# Patient Record
Sex: Female | Born: 1950
Health system: Southern US, Community
[De-identification: ages and names within clinical notes are randomized; demographics above are authoritative.]

## PROBLEM LIST (undated history)

## (undated) DIAGNOSIS — I10 Essential (primary) hypertension: Secondary | ICD-10-CM

## (undated) DIAGNOSIS — C801 Malignant (primary) neoplasm, unspecified: Secondary | ICD-10-CM

## (undated) DIAGNOSIS — R55 Syncope and collapse: Secondary | ICD-10-CM

## (undated) DIAGNOSIS — F329 Major depressive disorder, single episode, unspecified: Secondary | ICD-10-CM

## (undated) DIAGNOSIS — G4700 Insomnia, unspecified: Secondary | ICD-10-CM

## (undated) DIAGNOSIS — Z86010 Personal history of colonic polyps: Secondary | ICD-10-CM

## (undated) DIAGNOSIS — D649 Anemia, unspecified: Secondary | ICD-10-CM

## (undated) DIAGNOSIS — G8929 Other chronic pain: Secondary | ICD-10-CM

## (undated) DIAGNOSIS — F32A Depression, unspecified: Secondary | ICD-10-CM

## (undated) DIAGNOSIS — N200 Calculus of kidney: Secondary | ICD-10-CM

## (undated) HISTORY — PX: MASTECTOMY: SHX3

## (undated) HISTORY — DX: Calculus of kidney: N20.0

## (undated) HISTORY — DX: Malignant (primary) neoplasm, unspecified: C80.1

## (undated) HISTORY — DX: Personal history of colonic polyps: Z86.010

## (undated) HISTORY — DX: Major depressive disorder, single episode, unspecified: F32.9

## (undated) HISTORY — PX: PARTIAL HYSTERECTOMY: SHX80

## (undated) HISTORY — DX: Anemia, unspecified: D64.9

## (undated) HISTORY — DX: Syncope and collapse: R55

## (undated) HISTORY — PX: COLONOSCOPY: SHX174

## (undated) HISTORY — PX: CHOLECYSTECTOMY: SHX55

## (undated) HISTORY — PX: TOTAL ABDOMINAL HYSTERECTOMY: SHX209

## (undated) HISTORY — DX: Depression, unspecified: F32.A

## (undated) HISTORY — DX: Insomnia, unspecified: G47.00

## (undated) HISTORY — DX: Other chronic pain: G89.29

---

## 1981-05-18 HISTORY — PX: CHOLECYSTECTOMY: SHX55

## 1998-05-18 DIAGNOSIS — C50919 Malignant neoplasm of unspecified site of unspecified female breast: Secondary | ICD-10-CM

## 1998-05-18 HISTORY — DX: Malignant neoplasm of unspecified site of unspecified female breast: C50.919

## 1998-07-02 DIAGNOSIS — C50919 Malignant neoplasm of unspecified site of unspecified female breast: Secondary | ICD-10-CM | POA: Insufficient documentation

## 1998-07-11 ENCOUNTER — Other Ambulatory Visit: Admission: RE | Admit: 1998-07-11 | Discharge: 1998-07-11 | Payer: Self-pay | Admitting: Unknown Physician Specialty

## 1999-03-07 ENCOUNTER — Encounter: Admission: RE | Admit: 1999-03-07 | Discharge: 1999-06-05 | Payer: Self-pay | Admitting: Radiation Oncology

## 1999-05-07 ENCOUNTER — Encounter: Admission: RE | Admit: 1999-05-07 | Discharge: 1999-05-07 | Payer: Self-pay | Admitting: Radiation Oncology

## 2000-09-29 ENCOUNTER — Other Ambulatory Visit: Admission: RE | Admit: 2000-09-29 | Discharge: 2000-09-29 | Payer: Self-pay | Admitting: Oral Surgery

## 2004-08-14 ENCOUNTER — Ambulatory Visit: Payer: Self-pay | Admitting: Oncology

## 2005-08-12 ENCOUNTER — Ambulatory Visit: Payer: Self-pay | Admitting: Oncology

## 2006-02-18 ENCOUNTER — Ambulatory Visit: Payer: Self-pay | Admitting: Oncology

## 2006-08-05 ENCOUNTER — Ambulatory Visit: Payer: Self-pay | Admitting: Oncology

## 2006-12-29 ENCOUNTER — Ambulatory Visit: Payer: Self-pay | Admitting: Oncology

## 2007-09-21 ENCOUNTER — Inpatient Hospital Stay (HOSPITAL_COMMUNITY): Admission: RE | Admit: 2007-09-21 | Discharge: 2007-09-23 | Payer: Self-pay | Admitting: Obstetrics and Gynecology

## 2007-09-21 ENCOUNTER — Encounter (HOSPITAL_COMMUNITY): Payer: Self-pay | Admitting: Obstetrics and Gynecology

## 2007-09-27 ENCOUNTER — Inpatient Hospital Stay (HOSPITAL_COMMUNITY): Admission: AD | Admit: 2007-09-27 | Discharge: 2007-09-28 | Payer: Self-pay | Admitting: Obstetrics and Gynecology

## 2009-05-18 HISTORY — PX: TOTAL ABDOMINAL HYSTERECTOMY: SHX209

## 2010-09-30 NOTE — H&P (Signed)
Karina Shelton, Karina Shelton               ACCOUNT NO.:  [id number]   MEDICAL RECORD NO.:  [id number]          PATIENT TYPE:  AMB   LOCATION:  SDC                           [facility]ACILITY:  [facility]   PHYSICIAN:  Zelphia Cairo, MD    DATE OF BIRTH:  1950-07-26   DATE OF ADMISSION:  09/21/2007  DATE OF DISCHARGE:                              HISTORY & PHYSICAL   A 60 year old white female who initially presented to the office in  referral for an ovarian cyst.  She had a CT done at which time an  incidental right ovarian cyst was measured at 2 cm.  This was confirmed  with vaginal ultrasound.  She denies any abdominal pain, bloating,  weight gain or weight loss.  Ovarian cyst was followed on ultrasound and  remained stable for over 1 year.  Given that she is postmenopausal with  a history of breast cancer, she elected to have surgical removal of  ovarian cyst.  CA-125 was normal at 5.1. Given that she is  postmenopausal, she elected to have bilateral salpingo-oophorectomy.   PAST MEDICAL HISTORY:  1. Fibromyalgia.  2. Stage III. breast cancer.   SURGICAL HISTORY:  1. Right mastectomy and in January 2000 followed by chemotherapy and      radiation.  2. Left mastectomy in January 2000.  3. Cholecystectomy in 1971.  4. LAVH 1993.   ALLERGIES:  None.   MEDICATIONS:  Trazodone, Ultram and calcium.   OB HISTORY:  Includes three vaginal deliveries without complications.   GYN HISTORY:  Is negative for history of abnormal Pap smears.   FAMILY HISTORY:  Significant with a mother with breast cancer diagnosed  at the age of 4, deceased at the age of 5.   SOCIAL HISTORY:  Is negative for tobacco use   PHYSICAL EXAMINATION:  VITAL SIGNS:  Height 5 feet 5 inches, weight 226,  blood pressure 118/80, hemoglobin 14.7.  Urinalysis negative.  HEAD AND NECK:  Normal.  No thyromegaly or nodularity.  HEART:  Regular rate and rhythm.  LUNGS:  Clear bilaterally.  ABDOMEN:  Soft, nontender, nondistended.  PELVIC:  Exam shows normal external female genitalia and urethral  meatus.  Vagina is normal without lesions.  No adnexal masses are  palpated on exam.   Ultrasound performed August 15, 2007, showed a normal-appearing left  ovary, right ovary with a 19 x 14 x 13 mm simple cyst.  No fluid in the  cul-de-sac   ASSESSMENT AND PLAN:  A 60 year old postmenopausal woman with persistent  right ovarian cyst and a history of breast cancer.  Plan for bilateral  salpingo-oophorectomy. Risks, benefits and alternatives were discussed,  and informed consent was obtained.      Zelphia Cairo, MD  Electronically Signed     GA/MEDQ  D:  09/20/2007  T:  09/20/2007  Job:  [id number]

## 2010-09-30 NOTE — Op Note (Signed)
NAMESTORY, CONTI               ACCOUNT NO.:  [id number]   MEDICAL RECORD NO.:  [id number]          PATIENT TYPE:  INP   LOCATION:  9303                          [facility]ILITY:  [facility]   PHYSICIAN:  Zelphia Cairo, MD    DATE OF BIRTH:  06/29/1950   DATE OF PROCEDURE:  09/21/2007  DATE OF DISCHARGE:                               OPERATIVE REPORT   PREOPERATIVE DIAGNOSIS:  1. Ovarian cyst.  2. History of breast cancer.   PROCEDURE:  1. Laparoscopy.  2. Laparotomy with bilateral salpingo-oophorectomy.   SURGEON:  Zelphia Cairo, MD   ASSISTANT:  Duke Salvia. Marcelle Overlie, MD   ESTIMATED BLOOD LOSS:  200 mL.   URINE OUTPUT:  125 mL of clear urine.   COMPLICATIONS:  None.   CONDITION:  Stable to recovery room.   PROCEDURE:  Sylvie was taken to the operating room where she was placed  in the dorsal lithotomy position.  General anesthesia was obtained  without difficulty.  She was prepped and draped in the sterile fashion,  and a Foley catheter was inserted sterilely.  Our attention was then  turned to the abdomen, and infraumbilical skin incision was made with  the scalpel and this was extended bluntly to the level of the fascia  using a Kelly clamp.  An optical trocar was then used to enter the  peritoneal cavity.  Once intraperitoneal placement was confirmed, CO2  was turned on and the abdomen and pelvis were insufflated.  The patient  was placed in Trendelenburg position.  The suprapubic incision was made  with a scalpel, and a 5-mm trocar was inserted under direct  visualization.  A blunt probe was then inserted into the suprapubic  port, and the bowel was swept out of the cul-de-sac.  There was small  amount of adhesions of the large bowel to the vaginal cuff.  For this  reason, the left adnexal region could not be well visualized.  A third  port was placed in the left lower quadrant.  A 5-mm incision was made  with a scalpel, and a trocar was inserted under direct visualization.  Despite additional instrument being used to retract bowel, we were  unable to adequately visualize adnexa in order to perform bilateral  salpingo-oophorectomy.  Bilateral adnexa were noted to be adhered to the  sidewalls.  At this time, decision was made to proceed with laparotomy.   A Pfannenstiel skin incision was then made with a scalpel carried down  to the underlying fascia.  The fascia was incised in the midline that  was extended laterally using Mayo scissors.  Kocher clamps were used to  grasp the inferior portion of the fascia.  This was tented upwards and  the underlying rectus muscles were dissected off using Mayo scissors.  The superior portion of the fascia was then grasped with the Kocher  clamps.  This was tented upwards, and the underlying rectus muscles were  dissected off using Mayo scissors.  The peritoneum was then identified  and entered sharply.  This was extended superiorly and inferiorly with  good visualization of the bladder.  The  O'Connor-O'Sullivan retractor  was then placed.  The bowel was packed out of the pelvis using moist lap  sponges.  At this point, the left ovary was grasped with a Babcock  clamp.  The retroperitoneal space was opened, and the ureter was  identified.  The infundibular pelvic ligament was then doubly clamped  using Heaney clamp, transected with Mayo scissors and doubly suture  ligated.  Hemostasis was assured.  The left adnexa was then dissected  free of the left pelvic sidewall using Metzenbaum scissors keeping the  left ureter visualized and free of our operative field.  The remaining  band of adhesions between the sidewall and ovary was then clamped with a  Heaney clamp, transected with Mayo scissors and this was suture ligated  with Vicryl.  Once hemostasis was assured at the left pelvis, our  attention was turned to the right pelvis.   This procedure was repeated, the right ovary was grasped with a Babcock  clamp and tented  outwards.  The retroperitoneal space was opened using  Metzenbaum scissors and dissected along the course of the IP ligament.  The ureter was identified and found to be inferior to our operative  field and pedicle.  The IP ligament was then doubly clamped with Heaney  clamp, transected with Mayo scissors, and double ligated using Vicryl.  Hemostasis was assured.  The remainder of the right ovary was then freed  from the pelvic sidewall using sharp dissection with Metzenbaum  scissors.  Both specimens were passed off and sent to pathology for  review.  The pelvis was then irrigated with warm normal saline.  Both  pedicles and operative sites were reinspected to ensure hemostasis.  Retractor and packs were then removed from the abdomen and pelvis.  The  fascia was closed using a looped zero PDS in a running locked fashion,  and the skin was closed with staples.  Infraumbilical skin incision was  reapproximated using 3-0 Vicryl.  The right lower quadrant trocar site  was reapproximated using Dermabond.  The patient tolerated the procedure  well.  Sponge lap, needle, and instrument counts were correct x2.  She  was taken to the recovery room in stable condition.      Zelphia Cairo, MD  Electronically Signed     GA/MEDQ  D:  09/22/2007  T:  09/23/2007  Job:  [id number]

## 2012-02-06 ENCOUNTER — Emergency Department (HOSPITAL_COMMUNITY)
Admission: EM | Admit: 2012-02-06 | Discharge: 2012-02-06 | Disposition: A | Discharge status: Home / Self Care | Payer: BC Managed Care – PPO | Attending: Emergency Medicine | Admitting: Emergency Medicine

## 2012-02-06 ENCOUNTER — Emergency Department (HOSPITAL_COMMUNITY): Payer: BC Managed Care – PPO

## 2012-02-06 ENCOUNTER — Encounter (HOSPITAL_COMMUNITY): Payer: Self-pay | Admitting: Physical Medicine and Rehabilitation

## 2012-02-06 DIAGNOSIS — S060XAA Concussion with loss of consciousness status unknown, initial encounter: Secondary | ICD-10-CM

## 2012-02-06 DIAGNOSIS — S060X9A Concussion with loss of consciousness of unspecified duration, initial encounter: Secondary | ICD-10-CM | POA: Insufficient documentation

## 2012-02-06 DIAGNOSIS — Y92009 Unspecified place in unspecified non-institutional (private) residence as the place of occurrence of the external cause: Secondary | ICD-10-CM | POA: Insufficient documentation

## 2012-02-06 DIAGNOSIS — H1131 Conjunctival hemorrhage, right eye: Secondary | ICD-10-CM

## 2012-02-06 DIAGNOSIS — H1130 Conjunctival hemorrhage, unspecified eye: Secondary | ICD-10-CM | POA: Insufficient documentation

## 2012-02-06 DIAGNOSIS — W1809XA Striking against other object with subsequent fall, initial encounter: Secondary | ICD-10-CM | POA: Insufficient documentation

## 2012-02-06 DIAGNOSIS — H05239 Hemorrhage of unspecified orbit: Secondary | ICD-10-CM

## 2012-02-06 LAB — COMPREHENSIVE METABOLIC PANEL
ALT: 17 U/L (ref 0–35)
AST: 19 U/L (ref 0–37)
Albumin: 3.3 g/dL — ABNORMAL LOW (ref 3.5–5.2)
Alkaline Phosphatase: 91 U/L (ref 39–117)
BUN: 9 mg/dL (ref 6–23)
CO2: 30 mEq/L (ref 19–32)
Calcium: 9.1 mg/dL (ref 8.4–10.5)
Chloride: 105 mEq/L (ref 96–112)
Creatinine, Ser: 0.65 mg/dL (ref 0.50–1.10)
GFR calc Af Amer: >90 mL/min (ref >90)
GFR calc non Af Amer: >90 mL/min (ref >90)
Glucose, Bld: 91 mg/dL (ref 70–99)
Potassium: 3.4 mEq/L — ABNORMAL LOW (ref 3.5–5.1)
Sodium: 140 mEq/L (ref 135–145)
Total Bilirubin: 0.3 mg/dL (ref 0.3–1.2)
Total Protein: 6.3 g/dL (ref 6.0–8.3)

## 2012-02-06 LAB — CBC WITH DIFFERENTIAL/PLATELET
Basophils Absolute: 0 10*3/uL (ref 0.0–0.1)
Basophils Relative: 0 % (ref 0–1)
Eosinophils Absolute: 0.1 10*3/uL (ref 0.0–0.7)
Eosinophils Relative: 2 % (ref 0–5)
HCT: 38.8 % (ref 36.0–46.0)
Hemoglobin: 12.9 g/dL (ref 12.0–15.0)
Lymphocytes Relative: 36 % (ref 12–46)
Lymphs Abs: 1.6 10*3/uL (ref 0.7–4.0)
MCH: 29.1 pg (ref 26.0–34.0)
MCHC: 33.2 g/dL (ref 30.0–36.0)
MCV: 87.4 fL (ref 78.0–100.0)
Monocytes Absolute: 0.3 10*3/uL (ref 0.1–1.0)
Monocytes Relative: 7 % (ref 3–12)
Neutro Abs: 2.5 10*3/uL (ref 1.7–7.7)
Neutrophils Relative %: 54 % (ref 43–77)
Platelets: 210 10*3/uL (ref 150–400)
RBC: 4.44 MIL/uL (ref 3.87–5.11)
RDW: 12.5 % (ref 11.5–15.5)
WBC: 4.6 10*3/uL (ref 4.0–10.5)

## 2012-02-06 LAB — TROPONIN I: Troponin I: <0.3 ng/mL (ref <0.30)

## 2012-02-06 LAB — CK: Total CK: 73 U/L (ref 7–177)

## 2012-02-06 MED ORDER — HYDROCODONE-ACETAMINOPHEN 5-325 MG PO TABS
2.0000 | ORAL_TABLET | Freq: Once | ORAL | Status: AC
  Administered 2012-02-06: 2 via ORAL
  Filled 2012-02-06: qty 2

## 2012-02-06 MED ORDER — HYDROCODONE-ACETAMINOPHEN 5-325 MG PO TABS: ORAL_TABLET | ORAL | Status: DC

## 2012-02-06 NOTE — ED Notes (Signed)
Upon arrival to room, pt was immediately transport to CT, pt alert, no distress noted.

## 2012-02-06 NOTE — ED Notes (Signed)
Pt in s/p fall around 230 am today, pt was confused at time of fall per husband and was unsure of cause of fall, pt was found on flood by husband and assited back to bed. Pt states she was going to the bathroom but ended up in a different room and is unsure how she ended up there, pt admits to urinary incontinence at time of fall but states she was originally trying to go to the bathroom. Pt with history over last year of similar episodes, pt alert and oriented at this time, no neuro deficits noted. Pt with bruising noted to right side of face and redness to left eye. Pt c/o severe headache at this time, rated 10/10. Pt answering questions appropriately. Normal movement with all extremities.

## 2012-02-06 NOTE — ED Notes (Signed)
Pt presents to department for evaluation of fall. States she was at craft [employer] when she fell and struck corner of table. States LOC. Upon arrival pt states severe headache, abrasion/bruising noted to R forehead. R eye also noted to be red and swollen. Pt states "I don't feel like myself today." she is alert and answering questions appropriately. No neurological deficits noted at present.

## 2012-02-06 NOTE — Discharge Instructions (Signed)
 Subconjunctival Hemorrhage Your exam shows you have a subconjunctival hemorrhage. This is a harmless collection of blood covering a portion of the white of the eye. This condition may be due to injury or to straining (lifting, sneezing, or coughing). Often, there is no known cause. Subconjunctival blood does not cause pain or vision problems. This condition needs no treatment. It will take 1 to 2 weeks for the blood to dissolve. If you take aspirin or Coumadin on a daily basis or if you have high blood pressure, you should check with your doctor about the need for further treatment. Please call your doctor if you have problems with your vision, pain around the eye, or any other concerns about your condition. Document Released: 06/11/2004 Document Revised: 04/23/2011 Document Reviewed: 04/01/2009 [facility] Patient Information 2012 [place], [place].      Narcotic and benzodiazepine use may cause drowsiness, slowed breathing or dependence.  Please use with caution and do not drive, operate machinery or watch young children alone while taking them.  Taking combinations of these medications or drinking alcohol  will potentiate these effects.

## 2012-02-06 NOTE — ED Notes (Signed)
Pt returned to room from CT scan

## 2012-02-06 NOTE — ED Provider Notes (Addendum)
History     CSN: [id number]  Arrival date & time 02/06/12  1128   First MD Initiated Contact with Patient 02/06/12 1242      Chief Complaint  Patient presents with  . Altered Mental Status  . Headache  . Fall    (Consider location/radiation/quality/duration/timing/severity/associated sxs/prior treatment) HPI Comments: Last night, pt had to use restroom had gotten up in middle of night as she has done in the past, unsure of what happened, think she was going the wrong way, fell and struck head on table and possibly floor, has cruising adn swelling to right side of face and around eye.  No change in vision, has pain along right side of neck and top of shoulder.  No N/V/D.  Spouse reports pt was disoriented at the time, has improved now. Pt complains of HA.  No numbness or weakness of arms or legs.  She reports had passed out 2 weeks ago and also about 1 year ago while at [employer].  Has not had this previously worked up.  She had begun weaning herself off of Wellbutrin that she had been on for years about 3 months ago, taking half tablet daily, then going to every other day and now has stopped.  Otherwise no changes.  No h/o CAD, CVA.  She denies any recent vomiting, diarrhea, cold symptoms.  She did have a fever blister on bottom of lip that is improving, denies oral trauma from fall or from potential seizure.  She did have urinary incontinence.  She was able to call for spouse last night and he found her on floor, conscious, awake, just disoriented.    Patient is a 61 y.o. female presenting with altered mental status, headaches, and fall. The history is provided by the patient, the spouse and a relative.  Altered Mental Status Associated symptoms include headaches. Pertinent negatives include no chest pain, no abdominal pain and no shortness of breath.  Headache  Pertinent negatives include no fever, no palpitations, no shortness of breath and no nausea.  Fall Associated symptoms include  headaches. Pertinent negatives include no fever, no abdominal pain and no nausea.    History reviewed. No pertinent past medical history.  History reviewed. No pertinent past surgical history.  History reviewed. No pertinent family history.  History  Substance Use Topics  . Smoking status: Never Smoker   . Smokeless tobacco: Not on file  . Alcohol Use: No    OB History    Grav Para Term Preterm Abortions TAB SAB Ect Mult Living                  Review of Systems  Constitutional: Negative for fever and chills.  HENT: Negative for congestion and rhinorrhea.   Eyes: Positive for photophobia and redness. Negative for visual disturbance.  Respiratory: Negative for chest tightness and shortness of breath.   Cardiovascular: Negative for chest pain and palpitations.  Gastrointestinal: Negative for nausea, abdominal pain, diarrhea and blood in stool.  Genitourinary: Negative for dysuria and flank pain.  Musculoskeletal: Negative for back pain.  Skin: Positive for wound. Negative for rash.  Neurological: Positive for syncope and headaches.  Psychiatric/Behavioral: Positive for altered mental status.  All other systems reviewed and are negative.    Allergies  Codeine  Home Medications   Current Outpatient Rx  Name Route Sig Dispense Refill  . ACETAMINOPHEN 500 MG PO TABS Oral Take 500-1,500 mg by mouth every 6 (six) hours as needed. For pain    .  ADULT MULTIVITAMIN W/MINERALS CH Oral Take 1 tablet by mouth daily.    Frazier Butt BALANCE OP Ophthalmic Apply 1 drop to eye every 4 (four) hours as needed. Dry eyes    . TRAMADOL HCL 50 MG PO TABS Oral Take 50-100 mg by mouth every 6 (six) hours as needed. For pain    . TRAZODONE HCL 50 MG PO TABS Oral Take 50-100 mg by mouth at bedtime.    Marland Kitchen ZOLPIDEM TARTRATE 5 MG PO TABS Oral Take 5 mg by mouth at bedtime as needed.    Marland Kitchen HYDROCODONE-ACETAMINOPHEN 5-325 MG PO TABS  1-2 tablets po q 6 hours prn moderate to severe pain 20 tablet 0     BP 111/63  Pulse 67  Temp 98.2 F (36.8 C) (Oral)  Resp 22  SpO2 95%  Physical Exam  Nursing note and vitals reviewed. Constitutional: She is oriented to person, place, and time. She appears well-developed and well-nourished.  HENT:  Head: Normocephalic. Head is with contusion.    Eyes: Pupils are equal, round, and reactive to light. Right conjunctiva is injected. Right conjunctiva has a hemorrhage. No scleral icterus. Right eye exhibits normal extraocular motion. Left eye exhibits normal extraocular motion. Right pupil is round. Left pupil is round. Pupils are equal.  Neck: Normal range of motion. Neck supple.  Cardiovascular: Normal rate and regular rhythm.   No murmur heard. Pulmonary/Chest: Effort normal. No respiratory distress. She has no wheezes.  Abdominal: Soft. She exhibits no distension. There is no tenderness.  Neurological: She is alert and oriented to person, place, and time.  Skin: Skin is warm and dry.  Psychiatric: She has a normal mood and affect.    ED Course  Procedures (including critical care time)  Labs Reviewed  COMPREHENSIVE METABOLIC PANEL - Abnormal; Notable for the following:    Potassium 3.4 (*)     Albumin 3.3 (*)     All other components within normal limits  CBC WITH DIFFERENTIAL  TROPONIN I  CK   Ct Head Wo Contrast  02/06/2012  *RADIOLOGY REPORT*  Clinical Data: History of fall with altered mental status.  CT HEAD WITHOUT CONTRAST  Technique:  Contiguous axial images were obtained from the base of the skull through the vertex without contrast.  Comparison: No priors.  Findings: No acute displaced skull fractures are identified.  No acute intracranial abnormality.  Specifically, no evidence of acute post-traumatic intracranial hemorrhage, no definite regions of acute/subacute cerebral ischemia, no focal mass, mass effect, hydrocephalus or abnormal intra or extra-axial fluid collections. The visualized paranasal sinuses and mastoids are well  pneumatized.  IMPRESSION: 1.  No acute displaced skull fractures or acute intracranial abnormalities. 2.  The appearance of the brain is normal.   Original Report Authenticated By: Florencia Reasons, M.D.      1. Concussion   2. Periorbital hematoma   3. Conjunctival hemorrhage of right eye     RA saturation is 100% which I interpret to be normal.     3:48 PM Pt feels somewhat improved after vicodin.  Troponin is neg.  Pt I think is safe to be discharged, pt can follow up with PCP.  Likely concussion at this point.  No arrythmias see on cardiac monitoring while in the ED.     ECG at time 14:42 shows SR at rate 60, PAC noted, normal intervals, normal axis, no ST or T wave abn.  No priors available.    MDM  No CP, vitals are ok here,  not orthostatic. Pt is alert, oriented, normal coordination and non focal neuro deficits here.  Troponin, ECG are ok.  Head CT shows no fracture, intra-cranial hemorrhage, tumor.  Pt needs follow up with PCP and further testing as outpt for syncope.  Pt does not wish to be admitted.  No arrythmia's noted on monitor while here.  PERC negative.        Gavin Pound. Oletta Lamas, MD 02/06/12 1551  Gavin Pound. Stephfon Bovey, MD 02/06/12 4098

## 2012-09-01 ENCOUNTER — Encounter: Payer: Self-pay | Admitting: Internal Medicine

## 2012-10-05 ENCOUNTER — Ambulatory Visit (AMBULATORY_SURGERY_CENTER): Payer: BC Managed Care – PPO | Admitting: *Deleted

## 2012-10-05 ENCOUNTER — Encounter: Payer: Self-pay | Admitting: Internal Medicine

## 2012-10-05 VITALS — Ht 66.0 in | Wt 153.8 lb

## 2012-10-05 DIAGNOSIS — Z1211 Encounter for screening for malignant neoplasm of colon: Secondary | ICD-10-CM

## 2012-10-05 MED ORDER — NA SULFATE-K SULFATE-MG SULF 17.5-3.13-1.6 GM/177ML PO SOLN: ORAL | Status: DC

## 2012-10-19 ENCOUNTER — Encounter: Payer: Self-pay | Admitting: Internal Medicine

## 2012-10-19 ENCOUNTER — Ambulatory Visit (AMBULATORY_SURGERY_CENTER): Payer: BC Managed Care – PPO | Admitting: Internal Medicine

## 2012-10-19 VITALS — BP 141/59 | HR 60 | Temp 98.4°F | Resp 22 | Ht 66.0 in | Wt 153.0 lb

## 2012-10-19 DIAGNOSIS — Z860100 Personal history of colon polyps, unspecified: Secondary | ICD-10-CM | POA: Insufficient documentation

## 2012-10-19 DIAGNOSIS — Z1211 Encounter for screening for malignant neoplasm of colon: Secondary | ICD-10-CM

## 2012-10-19 DIAGNOSIS — D126 Benign neoplasm of colon, unspecified: Secondary | ICD-10-CM

## 2012-10-19 HISTORY — DX: Personal history of colon polyps, unspecified: Z86.0100

## 2012-10-19 HISTORY — DX: Personal history of colonic polyps: Z86.010

## 2012-10-19 MED ORDER — SODIUM CHLORIDE 0.9 % IV SOLN: 500.0000 mL | INTRAVENOUS | Status: DC

## 2012-10-19 NOTE — Progress Notes (Signed)
Called to room to assist during endoscopic procedure.  Patient ID and intended procedure confirmed with present staff. Received instructions for my participation in the procedure from the performing physician.  

## 2012-10-19 NOTE — Patient Instructions (Addendum)
Two tiny polyps were removed today. No other abnormalities were seen.  I think the polyps are benign. I will let you know pathology results and when to have another routine colonoscopy by mail.  I appreciate the opportunity to care for you. Iva Boop, MD, FACGYOU HAD AN ENDOSCOPIC PROCEDURE TODAY AT THE [facility]: Refer to the procedure report that was given to you for any specific questions about what was found during the examination.  If the procedure report does not answer your questions, please call your gastroenterologist to clarify.  If you requested that your care partner not be given the details of your procedure findings, then the procedure report has been included in a sealed envelope for you to review at your convenience later.  YOU SHOULD EXPECT: Some feelings of bloating in the abdomen. Passage of more gas than usual.  Walking can help get rid of the air that was put into your GI tract during the procedure and reduce the bloating. If you had a lower endoscopy (such as a colonoscopy or flexible sigmoidoscopy) you may notice spotting of blood in your stool or on the toilet paper. If you underwent a bowel prep for your procedure, then you may not have a normal bowel movement for a few days.  DIET: Your first meal following the procedure should be a light meal and then it is ok to progress to your normal diet.  A half-sandwich or bowl of soup is an example of a good first meal.  Heavy or fried foods are harder to digest and may make you feel nauseous or bloated.  Likewise meals heavy in dairy and vegetables can cause extra gas to form and this can also increase the bloating.  Drink plenty of fluids but you should avoid alcoholic beverages for 24 hours.  ACTIVITY: Your care partner should take you home directly after the procedure.  You should plan to take it easy, moving slowly for the rest of the day.  You can resume normal activity the day after the procedure however you  should NOT DRIVE or use heavy machinery for 24 hours (because of the sedation medicines used during the test).    SYMPTOMS TO REPORT IMMEDIATELY: A gastroenterologist can be reached at any hour.  During normal business hours, 8:30 AM to 5:00 PM Monday through Friday, call [phone].  After hours and on weekends, please call the GI answering service at [phone] who will take a message and have the physician on call contact you.   Following lower endoscopy (colonoscopy or flexible sigmoidoscopy):  Excessive amounts of blood in the stool  Significant tenderness or worsening of abdominal pains  Swelling of the abdomen that is new, acute  Fever of 100F or higher    FOLLOW UP: If any biopsies were taken you will be contacted by phone or by letter within the next 1-3 weeks.  Call your gastroenterologist if you have not heard about the biopsies in 3 weeks.  Our staff will call the home number listed on your records the next business day following your procedure to check on you and address any questions or concerns that you may have at that time regarding the information given to you following your procedure. This is a courtesy call and so if there is no answer at the home number and we have not heard from you through the emergency physician on call, we will assume that you have returned to your regular daily activities without incident.  SIGNATURES/CONFIDENTIALITY: You and/or your care partner have signed paperwork which will be entered into your electronic medical record.  These signatures attest to the fact that that the information above on your After Visit Summary has been reviewed and is understood.  Full responsibility of the confidentiality of this discharge information lies with you and/or your care-partner.   Information on polyps given to you today

## 2012-10-19 NOTE — Progress Notes (Signed)
Patient did not experience any of the following events: a burn prior to discharge; a fall within the facility; wrong site/side/patient/procedure/implant event; or a hospital transfer or hospital admission upon discharge from the facility. (G8907) Patient did not have preoperative order for IV antibiotic SSI prophylaxis. (G8918)  

## 2012-10-19 NOTE — Op Note (Signed)
[facility] [address] [place], [address]   COLONOSCOPY PROCEDURE REPORT  PATIENT: Karina Shelton, Karina Shelton  MR#: [id number] BIRTHDATE: [birth year] , 61  yrs. old GENDER: Female ENDOSCOPIST: Iva Boop, MD, [facility] REFERRED [id number] Shary Decamp, M.D. PROCEDURE DATE:  10/19/2012 PROCEDURE:   Colonoscopy with biopsy ASA CLASS:   Class II INDICATIONS:average risk screening and first colonoscopy. MEDICATIONS: propofol (Diprivan) 200mg  IV, MAC sedation, administered by CRNA, and These medications were titrated to patient response per physician's verbal order  DESCRIPTION OF PROCEDURE:   After the risks benefits and alternatives of the procedure were thoroughly explained, informed consent was obtained.  A digital rectal exam revealed no abnormalities of the rectum.   The LB [id number]  endoscope was introduced through the anus and advanced to the cecum, which was identified by both the appendix and ileocecal valve. No adverse events experienced.   The quality of the prep was excellent using Suprep  The instrument was then slowly withdrawn as the colon was fully examined.      COLON FINDINGS: Two sessile polyps measuring 2 and 3 mm in size were found in the descending colon.  A polypectomy was performed with cold forceps.  The resection was complete and the polyp tissue was completely retrieved.   The colon mucosa was otherwise normal.   A right colon retroflexion was performed.  Retroflexed views revealed no abnormalities. The time to cecum=2 minutes 09 seconds. Withdrawal time=11 minutes 59 seconds.  The scope was withdrawn and the procedure completed. COMPLICATIONS: There were no complications.  ENDOSCOPIC IMPRESSION: 1.   Two sessile polyps measuring 2 and 3 mm in size were found in the descending colon; polypectomy was performed with cold forceps 2.   The colon mucosa was otherwise normal  RECOMMENDATIONS: Timing of repeat colonoscopy will be determined by  pathology findings.   eSigned:  Iva Boop, MD, [facility] 10/19/2012 10:31 AM   cc: Feliciana Rossetti, MD, Gery Pray, MD and The Patient

## 2012-10-20 ENCOUNTER — Telehealth: Payer: Self-pay | Admitting: *Deleted

## 2012-10-20 NOTE — Telephone Encounter (Signed)
Name identifier, left message, follow-up 

## 2012-10-24 ENCOUNTER — Encounter: Payer: Self-pay | Admitting: Internal Medicine

## 2012-10-24 NOTE — Progress Notes (Signed)
Quick Note:  2 diminutive adenomas - repeat colon about 10/2017 ______

## 2015-09-13 DIAGNOSIS — Z853 Personal history of malignant neoplasm of breast: Secondary | ICD-10-CM | POA: Diagnosis not present

## 2015-09-21 ENCOUNTER — Encounter (HOSPITAL_COMMUNITY): Payer: Self-pay | Admitting: Emergency Medicine

## 2015-09-21 ENCOUNTER — Emergency Department (HOSPITAL_COMMUNITY)
Admission: EM | Admit: 2015-09-21 | Discharge: 2015-09-22 | Disposition: A | Discharge status: Home / Self Care | Payer: BLUE CROSS/BLUE SHIELD | Attending: Emergency Medicine | Admitting: Emergency Medicine

## 2015-09-21 ENCOUNTER — Emergency Department (HOSPITAL_COMMUNITY): Payer: BLUE CROSS/BLUE SHIELD

## 2015-09-21 DIAGNOSIS — Z86018 Personal history of other benign neoplasm: Secondary | ICD-10-CM | POA: Diagnosis not present

## 2015-09-21 DIAGNOSIS — I10 Essential (primary) hypertension: Secondary | ICD-10-CM | POA: Diagnosis not present

## 2015-09-21 DIAGNOSIS — Z87442 Personal history of urinary calculi: Secondary | ICD-10-CM | POA: Diagnosis not present

## 2015-09-21 DIAGNOSIS — Z853 Personal history of malignant neoplasm of breast: Secondary | ICD-10-CM | POA: Diagnosis not present

## 2015-09-21 DIAGNOSIS — R519 Headache, unspecified: Secondary | ICD-10-CM

## 2015-09-21 DIAGNOSIS — F329 Major depressive disorder, single episode, unspecified: Secondary | ICD-10-CM | POA: Insufficient documentation

## 2015-09-21 DIAGNOSIS — Z79899 Other long term (current) drug therapy: Secondary | ICD-10-CM | POA: Diagnosis not present

## 2015-09-21 DIAGNOSIS — R51 Headache: Secondary | ICD-10-CM | POA: Diagnosis present

## 2015-09-21 HISTORY — DX: Essential (primary) hypertension: I10

## 2015-09-21 LAB — RAPID URINE DRUG SCREEN, HOSP PERFORMED
AMPHETAMINES: NOT DETECTED
BENZODIAZEPINES: NOT DETECTED
Barbiturates: NOT DETECTED
COCAINE: NOT DETECTED
OPIATES: NOT DETECTED
Tetrahydrocannabinol: NOT DETECTED

## 2015-09-21 LAB — DIFFERENTIAL
BASOS PCT: 0 %
Basophils Absolute: 0 10*3/uL (ref 0.0–0.1)
EOS ABS: 0.1 10*3/uL (ref 0.0–0.7)
Eosinophils Relative: 3 %
Lymphocytes Relative: 43 %
Lymphs Abs: 2.4 10*3/uL (ref 0.7–4.0)
MONO ABS: 0.6 10*3/uL (ref 0.1–1.0)
MONOS PCT: 11 %
NEUTROS ABS: 2.5 10*3/uL (ref 1.7–7.7)
Neutrophils Relative %: 43 %

## 2015-09-21 LAB — I-STAT CHEM 8, ED
BUN: 15 mg/dL (ref 6–20)
CALCIUM ION: 1.16 mmol/L (ref 1.13–1.30)
CHLORIDE: 103 mmol/L (ref 101–111)
Creatinine, Ser: 1 mg/dL (ref 0.44–1.00)
Glucose, Bld: 120 mg/dL — ABNORMAL HIGH (ref 65–99)
HCT: 43 % (ref 36.0–46.0)
HEMOGLOBIN: 14.6 g/dL (ref 12.0–15.0)
POTASSIUM: 4.4 mmol/L (ref 3.5–5.1)
SODIUM: 143 mmol/L (ref 135–145)
TCO2: 28 mmol/L (ref 0–100)

## 2015-09-21 LAB — PROTIME-INR
INR: 1.02 (ref 0.00–1.49)
PROTHROMBIN TIME: 13.6 s (ref 11.6–15.2)

## 2015-09-21 LAB — CBC
HCT: 39.9 % (ref 36.0–46.0)
Hemoglobin: 13 g/dL (ref 12.0–15.0)
MCH: 28.6 pg (ref 26.0–34.0)
MCHC: 32.6 g/dL (ref 30.0–36.0)
MCV: 87.9 fL (ref 78.0–100.0)
PLATELETS: 270 10*3/uL (ref 150–400)
RBC: 4.54 MIL/uL (ref 3.87–5.11)
RDW: 13 % (ref 11.5–15.5)
WBC: 5.6 10*3/uL (ref 4.0–10.5)

## 2015-09-21 LAB — COMPREHENSIVE METABOLIC PANEL
ALT: 22 U/L (ref 14–54)
ANION GAP: 8 (ref 5–15)
AST: 23 U/L (ref 15–41)
Albumin: 3.2 g/dL — ABNORMAL LOW (ref 3.5–5.0)
Alkaline Phosphatase: 94 U/L (ref 38–126)
BUN: 11 mg/dL (ref 6–20)
CHLORIDE: 107 mmol/L (ref 101–111)
CO2: 29 mmol/L (ref 22–32)
Calcium: 9.1 mg/dL (ref 8.9–10.3)
Creatinine, Ser: 1.03 mg/dL — ABNORMAL HIGH (ref 0.44–1.00)
GFR, EST NON AFRICAN AMERICAN: 57 mL/min — AB (ref >60)
Glucose, Bld: 132 mg/dL — ABNORMAL HIGH (ref 65–99)
POTASSIUM: 4.2 mmol/L (ref 3.5–5.1)
SODIUM: 144 mmol/L (ref 135–145)
Total Bilirubin: 0.4 mg/dL (ref 0.3–1.2)
Total Protein: 6.2 g/dL — ABNORMAL LOW (ref 6.5–8.1)

## 2015-09-21 LAB — URINALYSIS, ROUTINE W REFLEX MICROSCOPIC
BILIRUBIN URINE: NEGATIVE
Glucose, UA: NEGATIVE mg/dL
HGB URINE DIPSTICK: NEGATIVE
Ketones, ur: 15 mg/dL — AB
Leukocytes, UA: NEGATIVE
Nitrite: NEGATIVE
PH: 6 (ref 5.0–8.0)
PROTEIN: NEGATIVE mg/dL
SPECIFIC GRAVITY, URINE: 1.023 (ref 1.005–1.030)

## 2015-09-21 LAB — ETHANOL

## 2015-09-21 LAB — APTT: APTT: 27 s (ref 24–37)

## 2015-09-21 LAB — I-STAT TROPONIN, ED: TROPONIN I, POC: 0 ng/mL (ref 0.00–0.08)

## 2015-09-21 IMAGING — CT CT HEAD W/O CM
4 series · 17 of 47 positions shown, 19 images · non-contrast
Comparison: [DATE]

CLINICAL DATA: Headache at the left temple with onset at 3 p.m.
today. Blurry vision. History of hypertension.

EXAM:
CT HEAD WITHOUT CONTRAST
TECHNIQUE: Contiguous axial images were obtained from the base of the skull
through the vertex without intravenous contrast.

[Series 2: head without · axial · non-contrast · 0.41mm/px · z∈[-208,-88]mm · 7 of 34 slices shown, 9 images]
[im 5/34  brain]
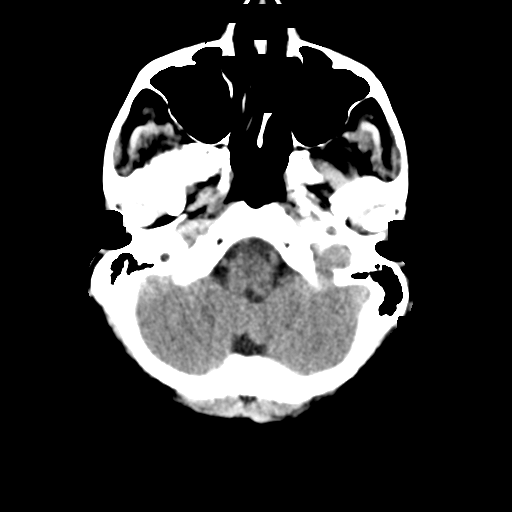
[im 5/34  bone]
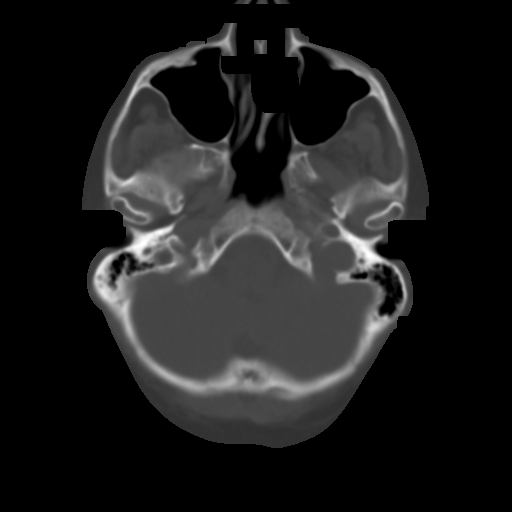
[im 9/34  brain]
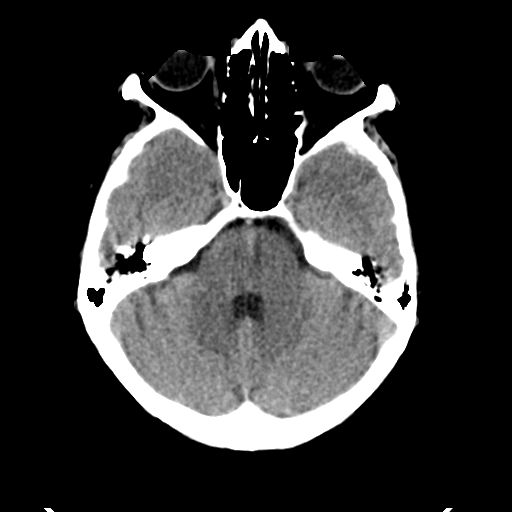
[im 13/34  brain]
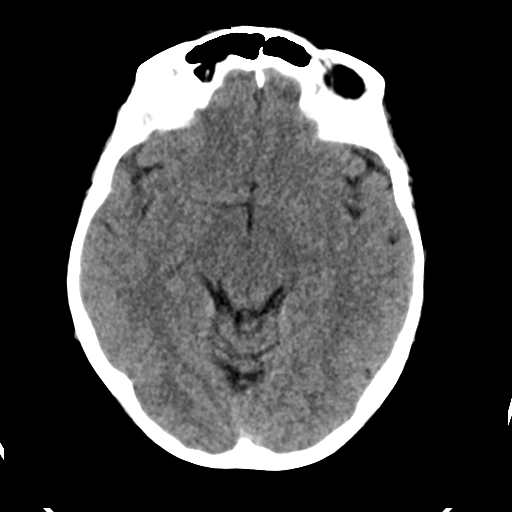
[im 17/34  brain]
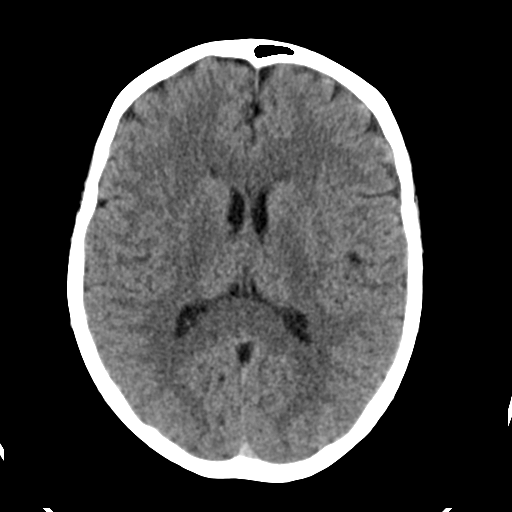
[im 21/34  brain]
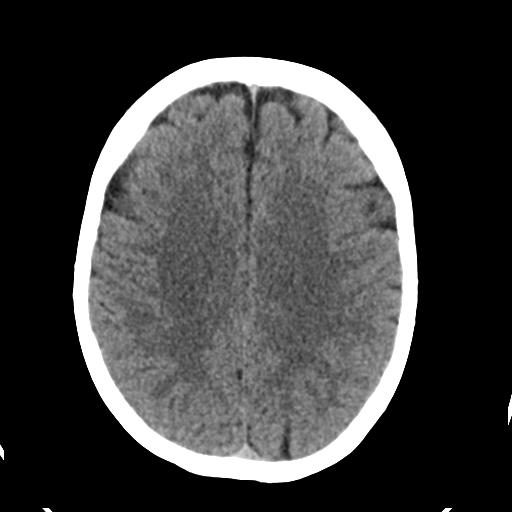
[im 21/34  bone]
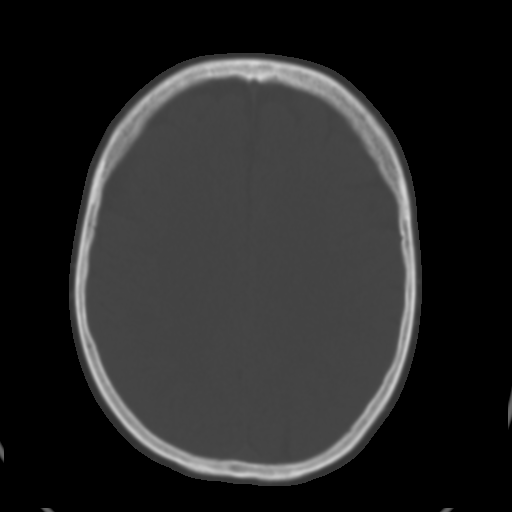
[im 25/34  brain]
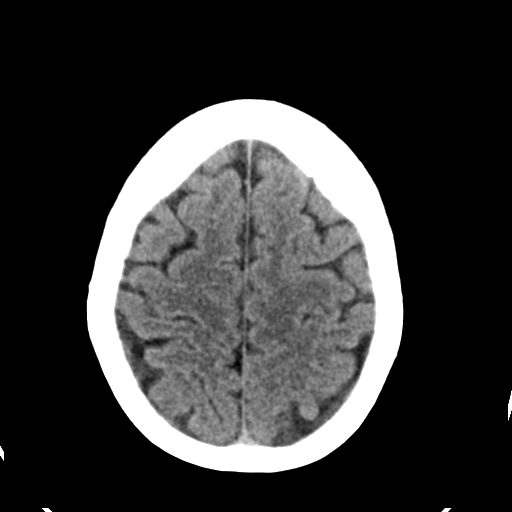
[im 29/34  brain]
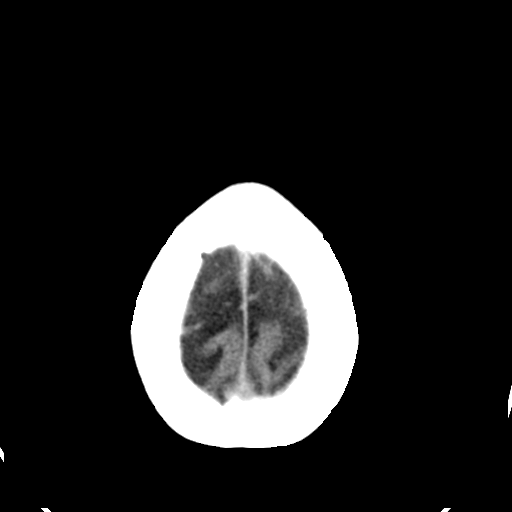

[Series 3: head bone · axial · 0.41mm/px · z∈[-212,-154]mm · 4 of 84 slices shown]
[im 9/84  bone]
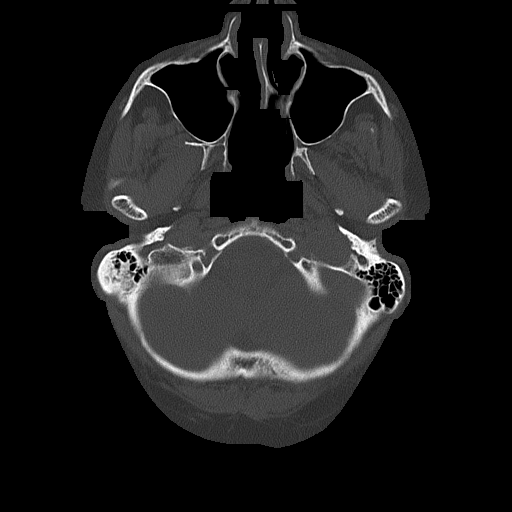
[im 17/84  bone]
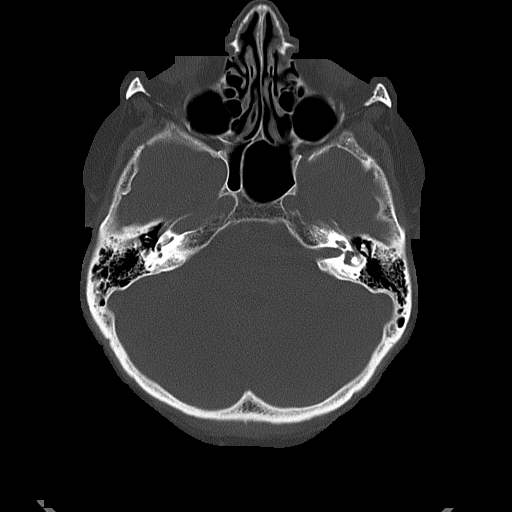
[im 25/84  bone]
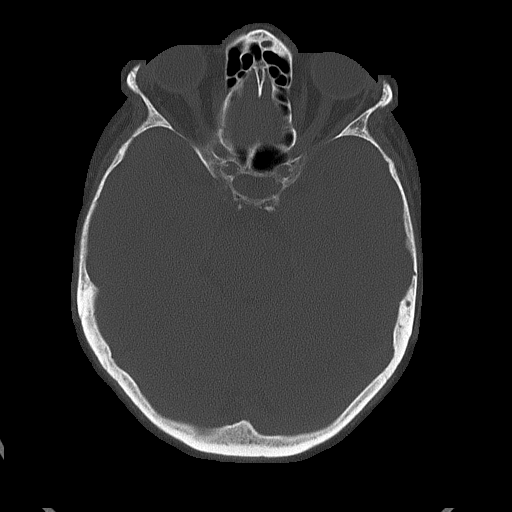
[im 38/84  bone]
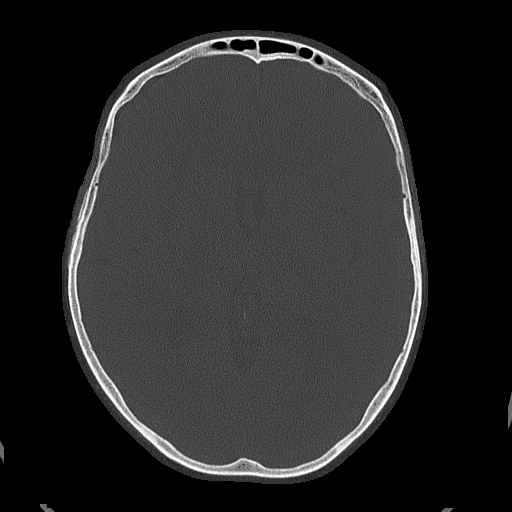

[Series 4: head without cor · coronal · non-contrast · 0.31mm/px · 3 of 67 slices shown]
[im 23/67  brain]
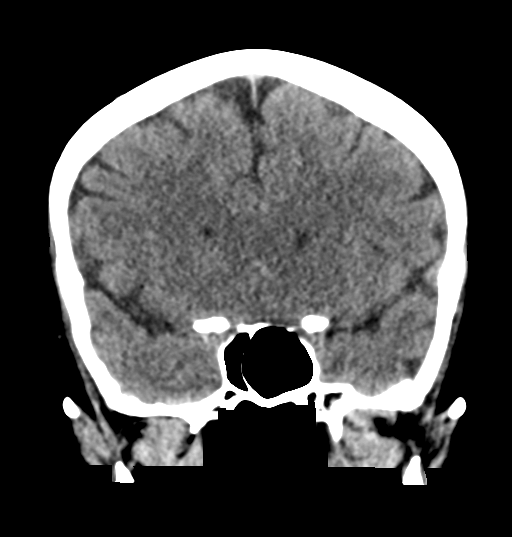
[im 30/67  brain]
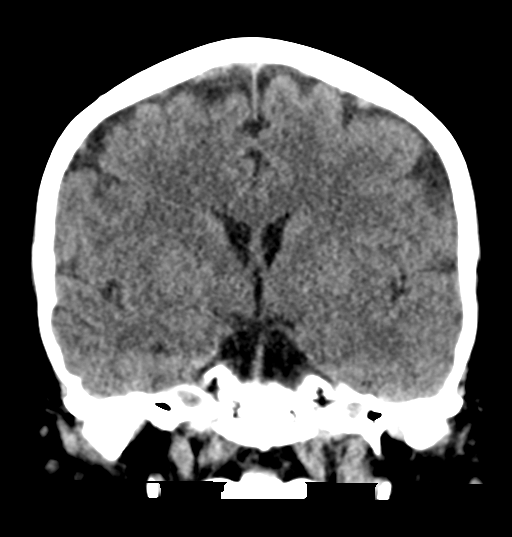
[im 37/67  brain]
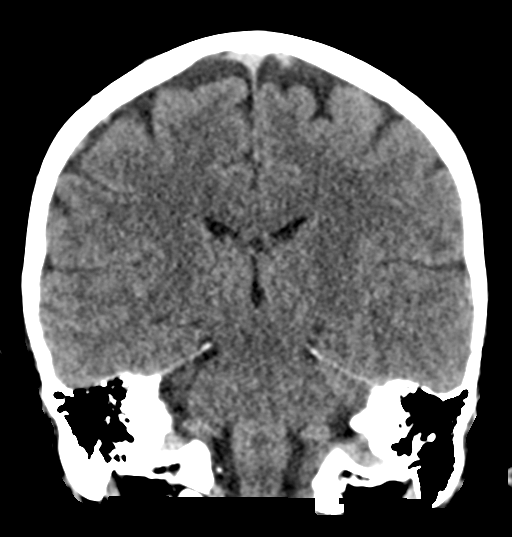

[Series 5: head without sag · sagittal · non-contrast · 0.33mm/px · 3 of 67 slices shown]
[im 23/67  brain]
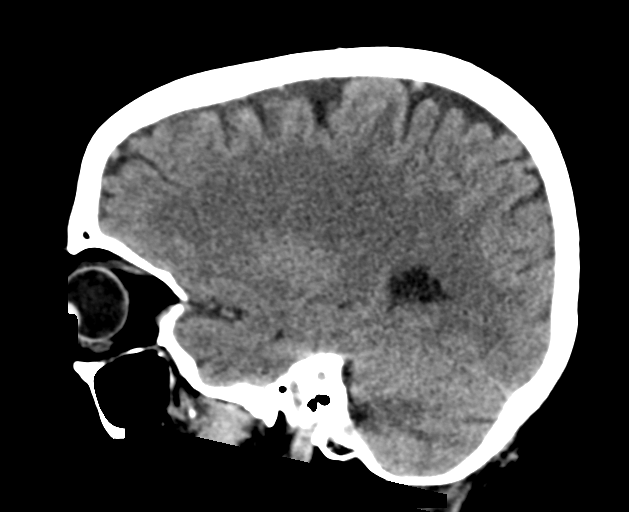
[im 34/67  brain]
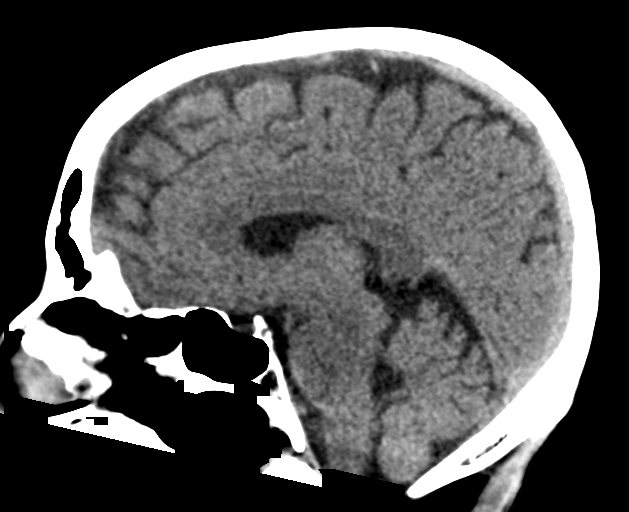
[im 45/67  brain]
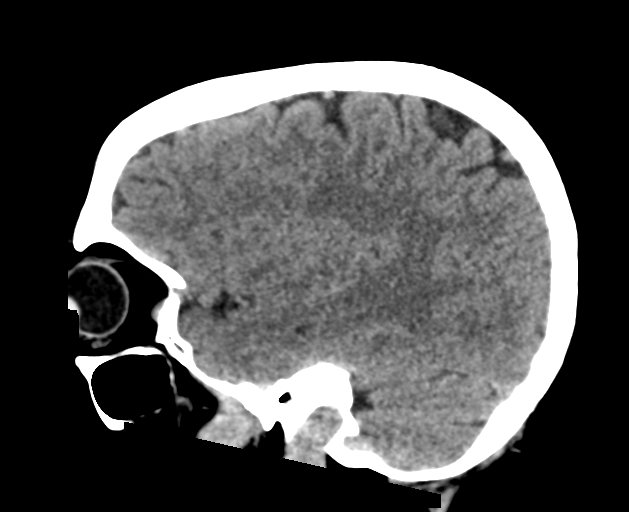

[17 of 47 positions shown; findings below may reference images not displayed]

FINDINGS: Ventricles and sulci are symmetrical. No ventricular dilatation. No
mass effect or midline shift. No abnormal extra-axial fluid
collections. Gray-white matter junctions are distinct. Basal
cisterns are not effaced. No evidence of acute intracranial
hemorrhage. No depressed skull fractures. Opacification of a single
ethmoid air cell. Paranasal sinuses and mastoid air cells are
otherwise clear. Vascular calcifications.
IMPRESSION: No acute intracranial abnormalities.

## 2015-09-21 MED ORDER — KETOROLAC TROMETHAMINE 60 MG/2ML IM SOLN
60.0000 mg | Freq: Once | INTRAMUSCULAR | Status: AC
  Administered 2015-09-21: 60 mg via INTRAMUSCULAR
  Filled 2015-09-21: qty 2

## 2015-09-21 MED ORDER — OXYCODONE-ACETAMINOPHEN 5-325 MG PO TABS
2.0000 | ORAL_TABLET | Freq: Once | ORAL | Status: AC
  Administered 2015-09-21: 2 via ORAL
  Filled 2015-09-21: qty 2

## 2015-09-21 NOTE — ED Provider Notes (Signed)
CSN: [id number]     Arrival date & time 09/21/15  2206 History   By signing my name below, I, Forrestine Him, attest that this documentation has been prepared under the direction and in the presence of Jola Schmidt, MD.  Electronically Signed: Forrestine Him, ED Scribe. 09/21/2015. 11:40 PM.   Chief Complaint  Patient presents with  . Headache   The history is provided by the patient. No language interpreter was used.    HPI Comments: Karina Shelton is a 65 y.o. female with a PMHx of HTN and cancer who presents to the Emergency Department complaining of sudden onset, constant, ongoing pain to the L temporal region onset 3:00 PM this afternoon while quilting. Pt states "it feels like ive been hit". She also reports ongoing blurred vision to eyes bilaterally; R greater than L. No aggravating or alleviating factors. No OTC medications or home remedies attempted prior to arrival. No recent fever, chills, nausea, vomiting, cough, congestion, or chest pain. Pt has a history of breast cancer 17 years ago involving chemotherapy, radiation, and bilateral mastectomy.  PCP: Gilford Rile, MD   ONCOLOGIST: Robbi Garter, MD  Past Medical History  Diagnosis Date  . Depression   . Cancer [facility]) 2000    right breast cancer  . Fainting     x3  . Kidney stone   . Personal history of colonic adenomas 10/19/2012  . Hypertension    Past Surgical History  Procedure Laterality Date  . Mastectomy  2000&2001    bil.  . Cholecystectomy    . Partial hysterectomy    . Total abdominal hysterectomy     Family History  Problem Relation Age of Onset  . Colon cancer Neg Hx    Social History  Substance Use Topics  . Smoking status: Never Smoker   . Smokeless tobacco: Never Used  . Alcohol Use: Yes     Comment: occasional only during events   OB History    No data available     Review of Systems  Constitutional: Negative for fever and chills.  HENT: Negative for congestion.   Respiratory: Negative for  cough and shortness of breath.   Cardiovascular: Negative for chest pain.  Gastrointestinal: Negative for nausea, vomiting and abdominal pain.  Neurological: Positive for headaches (Pain to L temporal area).  Psychiatric/Behavioral: Negative for confusion.  All other systems reviewed and are negative.     Allergies  Codeine  Home Medications   Prior to Admission medications   Medication Sig Start Date End Date Taking? Authorizing Provider  doxylamine, Sleep, (UNISOM) 25 MG tablet Take 25 mg by mouth at bedtime as needed for sleep.   Yes Historical Provider, MD  losartan (COZAAR) 50 MG tablet Take 50 mg by mouth daily. 08/27/15  Yes Historical Provider, MD  Omega-3 Fatty Acids (FISH OIL PO) Take 1 capsule by mouth daily.   Yes Historical Provider, MD  traMADol (ULTRAM) 50 MG tablet Take 50-100 mg by mouth every 6 (six) hours as needed. For pain   Yes Historical Provider, MD  traZODone (DESYREL) 50 MG tablet Take 50-100 mg by mouth at bedtime.   Yes Historical Provider, MD   Triage Vitals: BP 130/78 mmHg  Pulse 91  Temp(Src) 97.8 F (36.6 C) (Oral)  Resp 17  SpO2 98%   Physical Exam  Constitutional: She is oriented to person, place, and time. She appears well-developed and well-nourished. No distress.  HENT:  Head: Normocephalic and atraumatic.  Face symmetric.  Tenderness over left temporal region  without rash.  No scalp lesions noted.  No vesicular lesions over left temporal region.  Eyes: EOM are normal. Pupils are equal, round, and reactive to light.  Neck: Normal range of motion.  Cardiovascular: Normal rate, regular rhythm and normal heart sounds.   Pulmonary/Chest: Effort normal and breath sounds normal.  Abdominal: Soft. She exhibits no distension. There is no tenderness.  Musculoskeletal: Normal range of motion.  Neurological: She is alert and oriented to person, place, and time.  5/5 strength in major muscle groups of  bilateral upper and lower extremities. Speech  normal. No facial asymetry.   Skin: Skin is warm and dry.  Psychiatric: She has a normal mood and affect. Judgment normal.  Nursing note and vitals reviewed.   ED Course  Procedures (including critical care time)  DIAGNOSTIC STUDIES: Oxygen Saturation is 98% on RA, Normal by my interpretation.    COORDINATION OF CARE: 11:33 PM- Will order imaging, blood work, and EKG. Discussed treatment plan with pt at bedside and pt agreed to plan.     Labs Review Labs Reviewed  COMPREHENSIVE METABOLIC PANEL - Abnormal; Notable for the following:    Glucose, Bld 132 (*)    Creatinine, Ser 1.03 (*)    Total Protein 6.2 (*)    Albumin 3.2 (*)    GFR calc non Af Amer 57 (*)    All other components within normal limits  URINALYSIS, ROUTINE W REFLEX MICROSCOPIC (NOT AT [facility]) - Abnormal; Notable for the following:    APPearance HAZY (*)    Ketones, ur 15 (*)    All other components within normal limits  I-STAT CHEM 8, ED - Abnormal; Notable for the following:    Glucose, Bld 120 (*)    All other components within normal limits  ETHANOL  PROTIME-INR  APTT  CBC  DIFFERENTIAL  URINE RAPID DRUG SCREEN, HOSP PERFORMED  SEDIMENTATION RATE  C-REACTIVE PROTEIN  I-STAT TROPOININ, ED    Imaging Review Ct Head Wo Contrast  09/21/2015  CLINICAL DATA:  Headache at the left temple with onset at 3 p.m. today. Blurry vision. History of hypertension. EXAM: CT HEAD WITHOUT CONTRAST TECHNIQUE: Contiguous axial images were obtained from the base of the skull through the vertex without intravenous contrast. COMPARISON:  02/06/2012 FINDINGS: Ventricles and sulci are symmetrical. No ventricular dilatation. No mass effect or midline shift. No abnormal extra-axial fluid collections. Gray-white matter junctions are distinct. Basal cisterns are not effaced. No evidence of acute intracranial hemorrhage. No depressed skull fractures. Opacification of a single ethmoid air cell. Paranasal sinuses and mastoid air cells are  otherwise clear. Vascular calcifications. IMPRESSION: No acute intracranial abnormalities. Electronically Signed   By: Lucienne Capers M.D.   On: 09/21/2015 22:55   I have personally reviewed and evaluated these images and lab results as part of my medical decision-making.   EKG Interpretation None      MDM   Final diagnoses:  Headache, unspecified headache type  Temporal pain    Suspected either early zoster prior to rash given neuropathic-like pain versus temporal arteritis.  CT scan without acute pathology.  Sedimentation rate pending.  Patient be started on prednisone at this time for suspected temporal arteritis.  Outpatient ophthalmology follow-up.  No indication for additional testing tonight or emergent consultation.  She understands return to the ER for new or worsening symptoms.  Pain improved in the emergency department.  I personally performed the services described in this documentation, which was scribed in my presence. The recorded information  has been reviewed and is accurate.       Jola Schmidt, MD 09/22/15 [phone]

## 2015-09-21 NOTE — ED Notes (Signed)
Per Triage patient in CT will send to room once completed.

## 2015-09-21 NOTE — ED Notes (Addendum)
Patient states that she feels like she has been hit in the head at the left temple area.  She states that it is swelling and pain at that area.  She denies a headache, but only pain at that area.  She is CAOx4, no nausea or vomiting.  She is having some positional dizziness and feels her gait is off.  No slurred speech, no facial droop and equal hand grips.  Headache started at 1530.

## 2015-09-21 NOTE — ED Notes (Signed)
Patient still not in room. Patient still in triage area.

## 2015-09-22 LAB — C-REACTIVE PROTEIN: CRP: 0.8 mg/dL (ref <1.0)

## 2015-09-22 LAB — SEDIMENTATION RATE: SED RATE: 9 mm/h (ref 0–22)

## 2015-09-22 MED ORDER — PREDNISONE 20 MG PO TABS
60.0000 mg | ORAL_TABLET | Freq: Once | ORAL | Status: AC
  Administered 2015-09-22: 60 mg via ORAL
  Filled 2015-09-22: qty 3

## 2015-09-22 MED ORDER — PREDNISONE 10 MG PO TABS: 60.0000 mg | ORAL_TABLET | Freq: Every day | ORAL | Status: DC

## 2015-09-22 NOTE — Discharge Instructions (Signed)
This may represent TEMPORAL ARTERITIS (AKA GIANT CELL ARTERITIS) or early presentation of HERPES ZOSTER (AKA SHINGLES)  Please call the ophthalmologist for follow-up  Return to the emergency department for any new or worsening symptoms  Please involve the primary care physician in your outpatient workup

## 2016-12-07 DIAGNOSIS — Z853 Personal history of malignant neoplasm of breast: Secondary | ICD-10-CM | POA: Diagnosis not present

## 2016-12-07 DIAGNOSIS — Z9221 Personal history of antineoplastic chemotherapy: Secondary | ICD-10-CM | POA: Diagnosis not present

## 2016-12-07 DIAGNOSIS — D2261 Melanocytic nevi of right upper limb, including shoulder: Secondary | ICD-10-CM | POA: Diagnosis not present

## 2016-12-07 DIAGNOSIS — Z9013 Acquired absence of bilateral breasts and nipples: Secondary | ICD-10-CM | POA: Diagnosis not present

## 2016-12-07 DIAGNOSIS — Z171 Estrogen receptor negative status [ER-]: Secondary | ICD-10-CM | POA: Diagnosis not present

## 2016-12-07 DIAGNOSIS — D225 Melanocytic nevi of trunk: Secondary | ICD-10-CM | POA: Diagnosis not present

## 2017-05-17 ENCOUNTER — Other Ambulatory Visit: Payer: Self-pay | Admitting: Nurse Practitioner

## 2017-05-17 ENCOUNTER — Ambulatory Visit
Admission: RE | Admit: 2017-05-17 | Discharge: 2017-05-17 | Disposition: A | Discharge status: Home / Self Care | Payer: PPO | Source: Ambulatory Visit | Attending: Nurse Practitioner | Admitting: Nurse Practitioner

## 2017-05-17 DIAGNOSIS — R1032 Left lower quadrant pain: Secondary | ICD-10-CM

## 2017-05-17 DIAGNOSIS — M545 Low back pain: Secondary | ICD-10-CM | POA: Diagnosis not present

## 2017-05-17 DIAGNOSIS — N201 Calculus of ureter: Secondary | ICD-10-CM | POA: Diagnosis not present

## 2017-05-17 DIAGNOSIS — N200 Calculus of kidney: Secondary | ICD-10-CM | POA: Diagnosis not present

## 2017-05-17 DIAGNOSIS — N202 Calculus of kidney with calculus of ureter: Secondary | ICD-10-CM | POA: Diagnosis not present

## 2017-05-17 MED ORDER — IOPAMIDOL (ISOVUE-300) INJECTION 61%
100.0000 mL | Freq: Once | INTRAVENOUS | Status: AC | PRN
  Administered 2017-05-17: 100 mL via INTRAVENOUS

## 2017-05-24 DIAGNOSIS — N201 Calculus of ureter: Secondary | ICD-10-CM | POA: Diagnosis not present

## 2017-05-27 DIAGNOSIS — N201 Calculus of ureter: Secondary | ICD-10-CM | POA: Diagnosis not present

## 2017-06-02 DIAGNOSIS — H2513 Age-related nuclear cataract, bilateral: Secondary | ICD-10-CM | POA: Diagnosis not present

## 2017-07-07 DIAGNOSIS — F325 Major depressive disorder, single episode, in full remission: Secondary | ICD-10-CM | POA: Diagnosis not present

## 2017-07-07 DIAGNOSIS — F5101 Primary insomnia: Secondary | ICD-10-CM | POA: Diagnosis not present

## 2017-07-07 DIAGNOSIS — I10 Essential (primary) hypertension: Secondary | ICD-10-CM | POA: Diagnosis not present

## 2017-08-03 DIAGNOSIS — L821 Other seborrheic keratosis: Secondary | ICD-10-CM | POA: Diagnosis not present

## 2017-08-03 DIAGNOSIS — L570 Actinic keratosis: Secondary | ICD-10-CM | POA: Diagnosis not present

## 2017-09-13 DIAGNOSIS — J209 Acute bronchitis, unspecified: Secondary | ICD-10-CM | POA: Diagnosis not present

## 2017-10-07 ENCOUNTER — Ambulatory Visit (INDEPENDENT_AMBULATORY_CARE_PROVIDER_SITE_OTHER): Payer: PPO

## 2017-10-07 ENCOUNTER — Encounter (HOSPITAL_COMMUNITY): Payer: Self-pay | Admitting: Emergency Medicine

## 2017-10-07 ENCOUNTER — Ambulatory Visit (HOSPITAL_COMMUNITY)
Admission: EM | Admit: 2017-10-07 | Discharge: 2017-10-07 | Disposition: A | Discharge status: Home / Self Care | Payer: PPO | Attending: Family Medicine | Admitting: Family Medicine

## 2017-10-07 ENCOUNTER — Other Ambulatory Visit: Payer: Self-pay

## 2017-10-07 DIAGNOSIS — Z79899 Other long term (current) drug therapy: Secondary | ICD-10-CM | POA: Insufficient documentation

## 2017-10-07 DIAGNOSIS — Z800 Family history of malignant neoplasm of digestive organs: Secondary | ICD-10-CM | POA: Insufficient documentation

## 2017-10-07 DIAGNOSIS — S92515A Nondisplaced fracture of proximal phalanx of left lesser toe(s), initial encounter for closed fracture: Secondary | ICD-10-CM

## 2017-10-07 DIAGNOSIS — R300 Dysuria: Secondary | ICD-10-CM | POA: Insufficient documentation

## 2017-10-07 DIAGNOSIS — X58XXXA Exposure to other specified factors, initial encounter: Secondary | ICD-10-CM | POA: Diagnosis not present

## 2017-10-07 DIAGNOSIS — Z853 Personal history of malignant neoplasm of breast: Secondary | ICD-10-CM | POA: Insufficient documentation

## 2017-10-07 DIAGNOSIS — F329 Major depressive disorder, single episode, unspecified: Secondary | ICD-10-CM | POA: Insufficient documentation

## 2017-10-07 DIAGNOSIS — Z7982 Long term (current) use of aspirin: Secondary | ICD-10-CM | POA: Diagnosis not present

## 2017-10-07 DIAGNOSIS — I10 Essential (primary) hypertension: Secondary | ICD-10-CM | POA: Diagnosis not present

## 2017-10-07 DIAGNOSIS — S92505A Nondisplaced unspecified fracture of left lesser toe(s), initial encounter for closed fracture: Secondary | ICD-10-CM | POA: Diagnosis not present

## 2017-10-07 DIAGNOSIS — S99922A Unspecified injury of left foot, initial encounter: Secondary | ICD-10-CM | POA: Diagnosis present

## 2017-10-07 DIAGNOSIS — S92512A Displaced fracture of proximal phalanx of left lesser toe(s), initial encounter for closed fracture: Secondary | ICD-10-CM | POA: Diagnosis not present

## 2017-10-07 DIAGNOSIS — Z885 Allergy status to narcotic agent status: Secondary | ICD-10-CM | POA: Insufficient documentation

## 2017-10-07 DIAGNOSIS — Z87442 Personal history of urinary calculi: Secondary | ICD-10-CM | POA: Insufficient documentation

## 2017-10-07 LAB — POCT URINALYSIS DIP (DEVICE)
BILIRUBIN URINE: NEGATIVE
GLUCOSE, UA: NEGATIVE mg/dL
HGB URINE DIPSTICK: NEGATIVE
Ketones, ur: NEGATIVE mg/dL
LEUKOCYTES UA: NEGATIVE
Nitrite: NEGATIVE
Protein, ur: NEGATIVE mg/dL
Specific Gravity, Urine: >=1.03 (ref 1.005–1.030)
Urobilinogen, UA: 0.2 mg/dL (ref 0.0–1.0)
pH: 5.5 (ref 5.0–8.0)

## 2017-10-07 MED ORDER — HYDROCODONE-ACETAMINOPHEN 5-325 MG PO TABS: 1.0000 | ORAL_TABLET | Freq: Four times a day (QID) | ORAL | 0 refills | Status: DC | PRN

## 2017-10-07 NOTE — ED Notes (Signed)
Patient unable to void at this time

## 2017-10-07 NOTE — ED Triage Notes (Signed)
Burning with urination.    Injured left foot, middle toe pointed left.  .  Patient kicked the bed

## 2017-10-07 NOTE — Discharge Instructions (Addendum)
Follow-up with your primary care provider.  Generally all that needs to happen for a toe fracture is immobilization using buddy taping and the postop shoe.  I expect the toe to require 6 weeks to completely heal during which time he should continue to support the toe with the buddy taping and postop shoe.  Your urine shows no sign of infection.  It is important to continue to drink as you normally would to keep hydrated, especially with a hot temperatures expected in the next week.  Sometimes just dehydration can make the urine burn a little bit.  I am going the extra mile and culturing the urine to make sure there is no early infection that were missing.

## 2017-10-07 NOTE — ED Provider Notes (Signed)
[place]   [id number] 10/07/17 Arrival Time: 1510   SUBJECTIVE:  Keshanna Riso is a 67 y.o. female who presents to the urgent care with complaint of burning with urination.  Because of the injured toe, patient is not been going to the bathroom regularly because walking is so painful.  Also, patient injured left foot, middle toe pointed left after accidentally kicking the bed four days ago.  Patient has been buddy taping the toe since the injury under the direction of her physician son.   Past Medical History:  Diagnosis Date  . Cancer [facility]) 2000   right breast cancer  . Depression   . Fainting    x3  . Hypertension   . Kidney stone   . Personal history of colonic adenomas 10/19/2012   Family History  Problem Relation Age of Onset  . Colon cancer Neg Hx    Social History   Socioeconomic History  . Marital status: Married    Spouse name: Not on file  . Number of children: Not on file  . Years of education: Not on file  . Highest education level: Not on file  Occupational History  . Not on file  Social Needs  . Financial resource strain: Not on file  . Food insecurity:    Worry: Not on file    Inability: Not on file  . Transportation needs:    Medical: Not on file    Non-medical: Not on file  Tobacco Use  . Smoking status: Never Smoker  . Smokeless tobacco: Never Used  Substance and Sexual Activity  . Alcohol use: Yes    Comment: occasional only during events  . Drug use: No  . Sexual activity: Not on file  Lifestyle  . Physical activity:    Days per week: Not on file    Minutes per session: Not on file  . Stress: Not on file  Relationships  . Social connections:    Talks on phone: Not on file    Gets together: Not on file    Attends religious service: Not on file    Active member of club or organization: Not on file    Attends meetings of clubs or organizations: Not on file    Relationship status: Not on file  . Intimate partner violence:      Fear of current or ex partner: Not on file    Emotionally abused: Not on file    Physically abused: Not on file    Forced sexual activity: Not on file  Other Topics Concern  . Not on file  Social History Narrative  . Not on file   Current Meds  Medication Sig  . aspirin EC 81 MG tablet Take 81 mg by mouth daily.  Marland Kitchen buPROPion HCl (WELLBUTRIN PO) Take by mouth.  . traZODone (DESYREL) 50 MG tablet Take 50-100 mg by mouth at bedtime.  . vitamin C (ASCORBIC ACID) 500 MG tablet Take 500 mg by mouth daily.   Allergies  Allergen Reactions  . Codeine Itching      ROS: As per HPI, remainder of ROS negative.   OBJECTIVE:   Vitals:   10/07/17 1527  BP: 134/76  Pulse: 95  Resp: 18  Temp: 98.1 F (36.7 C)  TempSrc: Oral  SpO2: 97%     General appearance: alert; no distress Eyes: PERRL; EOMI; conjunctiva normal HENT: normocephalic; atraumatic;  oral mucosa normal Neck: supple Back: no CVA tenderness Extremities: no cyanosis or edema; symmetrical with no  gross deformities Skin: warm and dry Neurologic: antalgic gait; ecchymotic left middle toe with tenderness Psychological: alert and cooperative; normal mood and affect      Labs:  Results for orders placed or performed during the hospital encounter of 10/07/17  POCT urinalysis dip (device)  Result Value Ref Range   Glucose, UA NEGATIVE NEGATIVE mg/dL   Bilirubin Urine NEGATIVE NEGATIVE   Ketones, ur NEGATIVE NEGATIVE mg/dL   Specific Gravity, Urine >=1.030 1.005 - 1.030   Hgb urine dipstick NEGATIVE NEGATIVE   pH 5.5 5.0 - 8.0   Protein, ur NEGATIVE NEGATIVE mg/dL   Urobilinogen, UA 0.2 0.0 - 1.0 mg/dL   Nitrite NEGATIVE NEGATIVE   Leukocytes, UA NEGATIVE NEGATIVE    Labs Reviewed  URINE CULTURE  POCT URINALYSIS DIP (DEVICE)    Dg Foot Complete Left  Result Date: 10/07/2017 CLINICAL DATA:  Left middle toe pain after stubbing the foot against a bed post Sunday night. EXAM: LEFT FOOT - COMPLETE 3+ VIEW  COMPARISON:  None. FINDINGS: An acute oblique minimally displaced fracture involving the left third proximal phalanx is identified without intra-articular extension. Approximately 1 mm of lateral displacement of the distal fracture fragment is noted. No joint dislocation is seen. Small plantar calcaneal enthesophyte is identified. Minimal degenerative spurring across the dorsum of the talonavicular joint is noted. Accessory ossicle seen adjacent to the tarsal navicular. IMPRESSION: 1. Acute, closed oblique fracture of the left third proximal phalanx with 1 mm of lateral displacement. No intra-articular extension of fracture nor joint dislocation is identified. 2. Small plantar calcaneal spur. Electronically Signed   By: Ashley Royalty M.D.   On: 10/07/2017 15:52       ASSESSMENT & PLAN:  1. Closed nondisplaced fracture of proximal phalanx of lesser toe of left foot, initial encounter   2. Dysuria    Follow-up with your primary care provider.  Generally all that needs to happen for a toe fracture is immobilization using buddy taping and the postop shoe.  I expect the toe to require 6 weeks to completely heal during which time he should continue to support the toe with the buddy taping and postop shoe.  Your urine shows no sign of infection.  It is important to continue to drink as you normally would to keep hydrated, especially with a hot temperatures expected in the next week.  Sometimes just dehydration can make the urine burn a little bit.  I am going the extra mile and culturing the urine to make sure there is no early infection that were missing.  Meds ordered this encounter  Medications  . HYDROcodone-acetaminophen (NORCO) 5-325 MG tablet    Sig: Take 1 tablet by mouth every 6 (six) hours as needed for moderate pain.    Dispense:  12 tablet    Refill:  0    Reviewed expectations re: course of current medical issues. Questions answered. Outlined signs and symptoms indicating need for more  acute intervention. Patient verbalized understanding. After Visit Summary given.    Procedures:      Robyn Haber, MD 10/07/17 1620

## 2017-10-10 LAB — URINE CULTURE: Culture: 60000 — AB

## 2017-10-11 ENCOUNTER — Telehealth (HOSPITAL_COMMUNITY): Payer: Self-pay

## 2017-10-11 MED ORDER — CEPHALEXIN 500 MG PO CAPS: 500.0000 mg | ORAL_CAPSULE | Freq: Two times a day (BID) | ORAL | 0 refills | Status: AC

## 2017-10-11 NOTE — Telephone Encounter (Signed)
Urine culture positive for Klebsiella Pneumoniae this was not treated at urgent care visit. Rx for Keflex sent to pharmacy of choice.  Pt notified and is aware of results and new rx.

## 2018-01-03 ENCOUNTER — Encounter: Payer: Self-pay | Admitting: Internal Medicine

## 2018-02-02 DIAGNOSIS — I872 Venous insufficiency (chronic) (peripheral): Secondary | ICD-10-CM | POA: Diagnosis not present

## 2018-02-02 DIAGNOSIS — M25511 Pain in right shoulder: Secondary | ICD-10-CM | POA: Diagnosis not present

## 2018-02-02 DIAGNOSIS — Z79899 Other long term (current) drug therapy: Secondary | ICD-10-CM | POA: Diagnosis not present

## 2018-02-02 DIAGNOSIS — Z0000 Encounter for general adult medical examination without abnormal findings: Secondary | ICD-10-CM | POA: Diagnosis not present

## 2018-02-02 DIAGNOSIS — Z23 Encounter for immunization: Secondary | ICD-10-CM | POA: Diagnosis not present

## 2018-02-02 DIAGNOSIS — Z853 Personal history of malignant neoplasm of breast: Secondary | ICD-10-CM | POA: Diagnosis not present

## 2018-02-02 DIAGNOSIS — Z780 Asymptomatic menopausal state: Secondary | ICD-10-CM | POA: Diagnosis not present

## 2018-02-02 DIAGNOSIS — F325 Major depressive disorder, single episode, in full remission: Secondary | ICD-10-CM | POA: Diagnosis not present

## 2018-02-02 DIAGNOSIS — I10 Essential (primary) hypertension: Secondary | ICD-10-CM | POA: Diagnosis not present

## 2018-02-08 DIAGNOSIS — M8588 Other specified disorders of bone density and structure, other site: Secondary | ICD-10-CM | POA: Diagnosis not present

## 2018-02-08 DIAGNOSIS — Z780 Asymptomatic menopausal state: Secondary | ICD-10-CM | POA: Diagnosis not present

## 2018-03-30 DIAGNOSIS — Z853 Personal history of malignant neoplasm of breast: Secondary | ICD-10-CM | POA: Diagnosis not present

## 2018-03-30 DIAGNOSIS — Z9221 Personal history of antineoplastic chemotherapy: Secondary | ICD-10-CM | POA: Diagnosis not present

## 2018-03-30 DIAGNOSIS — Z923 Personal history of irradiation: Secondary | ICD-10-CM | POA: Diagnosis not present

## 2018-03-30 DIAGNOSIS — R42 Dizziness and giddiness: Secondary | ICD-10-CM | POA: Diagnosis not present

## 2018-12-13 ENCOUNTER — Ambulatory Visit (HOSPITAL_COMMUNITY)
Admission: EM | Admit: 2018-12-13 | Discharge: 2018-12-13 | Disposition: A | Discharge status: Home / Self Care | Payer: PPO | Attending: Family Medicine | Admitting: Family Medicine

## 2018-12-13 ENCOUNTER — Encounter (HOSPITAL_COMMUNITY): Payer: Self-pay | Admitting: Emergency Medicine

## 2018-12-13 ENCOUNTER — Other Ambulatory Visit: Payer: Self-pay

## 2018-12-13 DIAGNOSIS — W5503XA Scratched by cat, initial encounter: Secondary | ICD-10-CM

## 2018-12-13 DIAGNOSIS — S80819A Abrasion, unspecified lower leg, initial encounter: Secondary | ICD-10-CM

## 2018-12-13 DIAGNOSIS — S80812A Abrasion, left lower leg, initial encounter: Secondary | ICD-10-CM

## 2018-12-13 MED ORDER — TETANUS-DIPHTH-ACELL PERTUSSIS 5-2.5-18.5 LF-MCG/0.5 IM SUSP
INTRAMUSCULAR | Status: AC
  Filled 2018-12-13: qty 0.5

## 2018-12-13 MED ORDER — TETANUS-DIPHTH-ACELL PERTUSSIS 5-2.5-18.5 LF-MCG/0.5 IM SUSP
0.5000 mL | Freq: Once | INTRAMUSCULAR | Status: AC
  Administered 2018-12-13: 0.5 mL via INTRAMUSCULAR

## 2018-12-13 MED ORDER — AZITHROMYCIN 250 MG PO TABS: 250.0000 mg | ORAL_TABLET | Freq: Every day | ORAL | 0 refills | Status: DC

## 2018-12-13 NOTE — ED Provider Notes (Signed)
[place]    CSN: [id number] Arrival date & time: 12/13/18  1502     History   Chief Complaint Chief Complaint  Patient presents with  . Animal Bite    HPI Karina Shelton is a 68 y.o. female.   Patient presents with cat scratch on her left lower leg from her pet cat which occurred yesterday. Her cat is UTD on vaccinations.  She is concerned because the wounds are red and swollen and painful.  She does not know when her last tetanus shot was given.  She denies fever, chills, drainage from the wounds.    The history is provided by the patient.    Past Medical History:  Diagnosis Date  . Cancer [facility]) 2000   right breast cancer  . Depression   . Fainting    x3  . Hypertension   . Kidney stone   . Personal history of colonic adenomas 10/19/2012    Patient Active Problem List   Diagnosis Date Noted  . Personal history of colonic adenomas 10/19/2012    Past Surgical History:  Procedure Laterality Date  . CHOLECYSTECTOMY    . MASTECTOMY  2000&2001   bil.  Marland Kitchen PARTIAL HYSTERECTOMY    . TOTAL ABDOMINAL HYSTERECTOMY      OB History   No obstetric history on file.      Home Medications    Prior to Admission medications   Medication Sig Start Date End Date Taking? Authorizing Provider  aspirin EC 81 MG tablet Take 81 mg by mouth daily.    [provider]  azithromycin (ZITHROMAX) 250 MG tablet Take 1 tablet (250 mg total) by mouth daily. Take first 2 tablets together, then 1 every day until finished. 12/13/18   Sharion Balloon, NP  buPROPion HCl (WELLBUTRIN PO) Take by mouth.    [provider]  doxylamine, Sleep, (UNISOM) 25 MG tablet Take 25 mg by mouth at bedtime as needed for sleep.    [provider]  HYDROcodone-acetaminophen (NORCO) 5-325 MG tablet Take 1 tablet by mouth every 6 (six) hours as needed for moderate pain. 10/07/17   Robyn Haber, MD  Omega-3 Fatty Acids (FISH OIL PO) Take 1 capsule by mouth daily.    [provider]  traMADol (ULTRAM) 50 MG tablet Take 50-100 mg by mouth every 6 (six) hours as needed. For pain    [provider]  traZODone (DESYREL) 50 MG tablet Take 50-100 mg by mouth at bedtime.    [provider]  vitamin C (ASCORBIC ACID) 500 MG tablet Take 500 mg by mouth daily.    [provider]    Family History Family History  Problem Relation Age of Onset  . Colon cancer Neg Hx     Social History Social History   Tobacco Use  . Smoking status: Never Smoker  . Smokeless tobacco: Never Used  Substance Use Topics  . Alcohol use: Yes    Comment: occasional only during events  . Drug use: No     Allergies   Codeine   Review of Systems Review of Systems  Constitutional: Negative for chills and fever.  HENT: Negative for ear pain and sore throat.   Eyes: Negative for pain and visual disturbance.  Respiratory: Negative for cough and shortness of breath.   Cardiovascular: Negative for chest pain and palpitations.  Gastrointestinal: Negative for abdominal pain and vomiting.  Genitourinary: Negative for dysuria and hematuria.  Musculoskeletal: Negative for arthralgias and back pain.  Skin: Positive for wound. Negative for color change and rash.  Neurological: Negative for seizures and syncope.  All other systems reviewed and are negative.    Physical Exam Triage Vital Signs ED Triage Vitals  Enc Vitals Group     BP 12/13/18 1521 134/89     Pulse Rate 12/13/18 1521 85     Resp 12/13/18 1521 16     Temp 12/13/18 1521 97.8 F (36.6 C)     Temp Source 12/13/18 1521 Temporal     SpO2 12/13/18 1521 100 %     Weight --      Height --      Head Circumference --      Peak Flow --      Pain Score 12/13/18 1535 7     Pain Loc --      Pain Edu? --      Excl. in [place]? --    No data found.  Updated Vital Signs BP 134/89 (BP Location: Left Arm)   Pulse 85   Temp 97.8 F (36.6 C) (Temporal)   Resp 16   SpO2 100%   Visual Acuity  Right Eye Distance:   Left Eye Distance:   Bilateral Distance:    Right Eye Near:   Left Eye Near:    Bilateral Near:     Physical Exam Vitals signs and nursing note reviewed.  Constitutional:      General: She is not in acute distress.    Appearance: She is well-developed.  HENT:     Head: Normocephalic and atraumatic.  Eyes:     Conjunctiva/sclera: Conjunctivae normal.  Neck:     Musculoskeletal: Neck supple.  Cardiovascular:     Rate and Rhythm: Normal rate and regular rhythm.  Pulmonary:     Effort: Pulmonary effort is normal. No respiratory distress.     Breath sounds: Normal breath sounds.  Abdominal:     Palpations: Abdomen is soft.     Tenderness: There is no abdominal tenderness.  Skin:    General: Skin is warm and dry.     Comments: Wounds on left lower leg.  See picture for details.  Neurological:     Mental Status: She is alert.        UC Treatments / Results  Labs (all labs ordered are listed, but only abnormal results are displayed) Labs Reviewed - No data to display  EKG   Radiology No results found.  Procedures Procedures (including critical care time)  Medications Ordered in UC Medications  Tdap (BOOSTRIX) injection 0.5 mL (has no administration in time range)    Initial Impression / Assessment and Plan / UC Course  I have reviewed the triage vital signs and the nursing notes.  Pertinent labs & imaging results that were available during my care of the patient were reviewed by me and considered in my medical decision making (see chart for details).   Cat scratch on left lower leg.  Treating with Zithromax.  Tetanus updated today.   Wound care instructions given to patient the wounds clean and dry; to wash them twice daily with soap and water then apply antibiotic cream and bandage.  Discussed signs of infection and that she should seek treatment if she has increased redness, pain, purulent drainage, fever, chills, red streaks.      Final Clinical Impressions(s) / UC Diagnoses   Final diagnoses:  Cat scratch of lower leg, initial encounter     Discharge Instructions     Take  the antibiotic Zithromax as prescribed.    Keep your wounds clean and dry.  Wash them twice a day with soap and water, then apply antibiotic ointment and a bandage.    Watch for signs of infection including increased redness, pain, red streaks, pus-like drainage, fever, chills.  Go to the emergency department or to an urgent care if you develop signs of an infection.        ED Prescriptions    Medication Sig Dispense Auth. Provider   azithromycin (ZITHROMAX) 250 MG tablet Take 1 tablet (250 mg total) by mouth daily. Take first 2 tablets together, then 1 every day until finished. 6 tablet Sharion Balloon, NP     Controlled Substance Prescriptions [place] Controlled Substance Registry consulted? Not Applicable   Sharion Balloon, NP 12/13/18 [phone]

## 2018-12-13 NOTE — Discharge Instructions (Signed)
Take the antibiotic Zithromax as prescribed.    Keep your wounds clean and dry.  Wash them twice a day with soap and water, then apply antibiotic ointment and a bandage.    Watch for signs of infection including increased redness, pain, red streaks, pus-like drainage, fever, chills.  Go to the emergency department or to an urgent care if you develop signs of an infection.

## 2018-12-13 NOTE — ED Triage Notes (Signed)
Pt states she was scratched by her cat yesterday, wounds are on L lower leg, red and swollen. Up to date on shots.

## 2019-02-21 DIAGNOSIS — G8929 Other chronic pain: Secondary | ICD-10-CM | POA: Diagnosis not present

## 2019-02-21 DIAGNOSIS — D369 Benign neoplasm, unspecified site: Secondary | ICD-10-CM | POA: Diagnosis not present

## 2019-02-21 DIAGNOSIS — F325 Major depressive disorder, single episode, in full remission: Secondary | ICD-10-CM | POA: Diagnosis not present

## 2019-02-21 DIAGNOSIS — I10 Essential (primary) hypertension: Secondary | ICD-10-CM | POA: Diagnosis not present

## 2019-02-21 DIAGNOSIS — Z79899 Other long term (current) drug therapy: Secondary | ICD-10-CM | POA: Diagnosis not present

## 2019-02-21 DIAGNOSIS — Z23 Encounter for immunization: Secondary | ICD-10-CM | POA: Diagnosis not present

## 2019-02-21 DIAGNOSIS — M25572 Pain in left ankle and joints of left foot: Secondary | ICD-10-CM | POA: Diagnosis not present

## 2019-02-21 DIAGNOSIS — Z0000 Encounter for general adult medical examination without abnormal findings: Secondary | ICD-10-CM | POA: Diagnosis not present

## 2019-02-21 DIAGNOSIS — F5101 Primary insomnia: Secondary | ICD-10-CM | POA: Diagnosis not present

## 2019-02-25 ENCOUNTER — Other Ambulatory Visit: Payer: Self-pay

## 2019-02-25 ENCOUNTER — Encounter (HOSPITAL_COMMUNITY): Payer: Self-pay

## 2019-02-25 ENCOUNTER — Ambulatory Visit (HOSPITAL_COMMUNITY)
Admission: EM | Admit: 2019-02-25 | Discharge: 2019-02-25 | Disposition: A | Discharge status: Home / Self Care | Payer: PPO | Attending: Family Medicine | Admitting: Family Medicine

## 2019-02-25 DIAGNOSIS — R05 Cough: Secondary | ICD-10-CM | POA: Diagnosis not present

## 2019-02-25 DIAGNOSIS — R509 Fever, unspecified: Secondary | ICD-10-CM

## 2019-02-25 DIAGNOSIS — R059 Cough, unspecified: Secondary | ICD-10-CM

## 2019-02-25 DIAGNOSIS — Z20828 Contact with and (suspected) exposure to other viral communicable diseases: Secondary | ICD-10-CM

## 2019-02-25 DIAGNOSIS — Z20822 Contact with and (suspected) exposure to covid-19: Secondary | ICD-10-CM

## 2019-02-25 NOTE — ED Provider Notes (Signed)
[facility]    CSN: [id number] Arrival date & time: 02/25/19  1321      History   Chief Complaint Chief Complaint  Patient presents with  . Cough  . Fever    HPI Karina Shelton is a 68 y.o. female.   Presents with nonproductive cough and low-grade fever x2 to 3 days.  She denies sore throat, shortness of breath, abdominal pain, vomiting, diarrhea, rash, or other symptoms.  Patient requests COVID test.  The history is provided by the patient.    Past Medical History:  Diagnosis Date  . Cancer [facility]) 2000   right breast cancer  . Depression   . Fainting    x3  . Hypertension   . Kidney stone   . Personal history of colonic adenomas 10/19/2012    Patient Active Problem List   Diagnosis Date Noted  . Personal history of colonic adenomas 10/19/2012    Past Surgical History:  Procedure Laterality Date  . CHOLECYSTECTOMY    . MASTECTOMY  2000&2001   bil.  Marland Kitchen PARTIAL HYSTERECTOMY    . TOTAL ABDOMINAL HYSTERECTOMY      OB History   No obstetric history on file.      Home Medications    Prior to Admission medications   Medication Sig Start Date End Date Taking? Authorizing Provider  traMADol (ULTRAM) 50 MG tablet Take 50-100 mg by mouth every 6 (six) hours as needed. For pain   Yes [provider]  aspirin EC 81 MG tablet Take 81 mg by mouth daily.    [provider]  azithromycin (ZITHROMAX) 250 MG tablet Take 1 tablet (250 mg total) by mouth daily. Take first 2 tablets together, then 1 every day until finished. 12/13/18   Sharion Balloon, NP  buPROPion HCl (WELLBUTRIN PO) Take by mouth.    [provider]  doxylamine, Sleep, (UNISOM) 25 MG tablet Take 25 mg by mouth at bedtime as needed for sleep.    [provider]  HYDROcodone-acetaminophen (NORCO) 5-325 MG tablet Take 1 tablet by mouth every 6 (six) hours as needed for moderate pain. 10/07/17   Robyn Haber, MD  Omega-3 Fatty Acids (FISH OIL PO) Take 1 capsule by  mouth daily.    [provider]  traZODone (DESYREL) 50 MG tablet Take 50-100 mg by mouth at bedtime.    [provider]  vitamin C (ASCORBIC ACID) 500 MG tablet Take 500 mg by mouth daily.    [provider]    Family History Family History  Problem Relation Age of Onset  . Cancer Mother   . Cancer Father   . Colon cancer Neg Hx     Social History Social History   Tobacco Use  . Smoking status: Never Smoker  . Smokeless tobacco: Never Used  Substance Use Topics  . Alcohol use: Yes    Comment: occasional only during events  . Drug use: No     Allergies   Codeine   Review of Systems Review of Systems  Constitutional: Positive for fever. Negative for chills.  HENT: Negative for ear pain and sore throat.   Eyes: Negative for pain and visual disturbance.  Respiratory: Positive for cough. Negative for shortness of breath.   Cardiovascular: Negative for chest pain and palpitations.  Gastrointestinal: Negative for abdominal pain, diarrhea and vomiting.  Genitourinary: Negative for dysuria and hematuria.  Musculoskeletal: Negative for arthralgias and back pain.  Skin: Negative for color change and rash.  Neurological: Negative  for seizures and syncope.  All other systems reviewed and are negative.    Physical Exam Triage Vital Signs ED Triage Vitals  Enc Vitals Group     BP 02/25/19 1346 (!) 124/56     Pulse Rate 02/25/19 1346 89     Resp 02/25/19 1346 16     Temp 02/25/19 1346 98.5 F (36.9 C)     Temp Source 02/25/19 1346 Temporal     SpO2 02/25/19 1346 96 %     Weight --      Height --      Head Circumference --      Peak Flow --      Pain Score 02/25/19 1344 0     Pain Loc --      Pain Edu? --      Excl. in [place]? --    No data found.  Updated Vital Signs BP (!) 124/56 (BP Location: Left Arm)   Pulse 89   Temp 98.5 F (36.9 C) (Temporal)   Resp 16   SpO2 96%   Visual Acuity Right Eye Distance:   Left Eye Distance:    Bilateral Distance:    Right Eye Near:   Left Eye Near:    Bilateral Near:     Physical Exam Vitals signs and nursing note reviewed.  Constitutional:      General: She is not in acute distress.    Appearance: She is well-developed.  HENT:     Head: Normocephalic and atraumatic.     Right Ear: Tympanic membrane normal.     Left Ear: Tympanic membrane normal.     Mouth/Throat:     Mouth: Mucous membranes are moist.     Pharynx: Oropharynx is clear.  Eyes:     Conjunctiva/sclera: Conjunctivae normal.  Neck:     Musculoskeletal: Neck supple.  Cardiovascular:     Rate and Rhythm: Normal rate and regular rhythm.     Heart sounds: No murmur.  Pulmonary:     Effort: Pulmonary effort is normal. No respiratory distress.     Breath sounds: Normal breath sounds. No wheezing or rhonchi.  Abdominal:     General: Bowel sounds are normal.     Palpations: Abdomen is soft.     Tenderness: There is no abdominal tenderness. There is no guarding or rebound.  Skin:    General: Skin is warm and dry.     Findings: No rash.  Neurological:     Mental Status: She is alert and oriented to person, place, and time.  Psychiatric:        Mood and Affect: Mood normal.        Behavior: Behavior normal.      UC Treatments / Results  Labs (all labs ordered are listed, but only abnormal results are displayed) Labs Reviewed  NOVEL CORONAVIRUS, NAA (HOSP ORDER, SEND-OUT TO REF LAB; TAT 18-24 HRS)    EKG   Radiology No results found.  Procedures Procedures (including critical care time)  Medications Ordered in UC Medications - No data to display  Initial Impression / Assessment and Plan / UC Course  I have reviewed the triage vital signs and the nursing notes.  Pertinent labs & imaging results that were available during my care of the patient were reviewed by me and considered in my medical decision making (see chart for details).    Cough, suspected COVID.  COVID test performed here.   Instructed patient to self quarantine until her test results are  back.  Instructed patient to go to the emergency department if she develops high fever, shortness of breath, severe diarrhea, or other concerning symptoms.  Patient agrees with plan of care.      Final Clinical Impressions(s) / UC Diagnoses   Final diagnoses:  Cough  Suspected COVID-19 virus infection     Discharge Instructions     Your COVID test is pending.  You should self quarantine until your test result is back and is negative.    Go to the emergency department if you develop high fever, shortness of breath, severe diarrhea, or other concerning symptoms.       ED Prescriptions    None     PDMP not reviewed this encounter.   Sharion Balloon, NP 02/25/19 [phone]

## 2019-02-25 NOTE — ED Triage Notes (Signed)
Patient presents to Urgent Care with complaints of cough and fever since a few days ago. Patient reports she would like to know if it is COVID because her husband is high risk.

## 2019-02-25 NOTE — Discharge Instructions (Addendum)
Your COVID test is pending.  You should self quarantine until your test result is back and is negative.   ° °Go to the emergency department if you develop high fever, shortness of breath, severe diarrhea, or other concerning symptoms.   ° °

## 2019-02-26 LAB — NOVEL CORONAVIRUS, NAA (HOSP ORDER, SEND-OUT TO REF LAB; TAT 18-24 HRS): SARS-CoV-2, NAA: NOT DETECTED

## 2019-03-15 ENCOUNTER — Ambulatory Visit (AMBULATORY_SURGERY_CENTER): Payer: Self-pay | Admitting: *Deleted

## 2019-03-15 ENCOUNTER — Encounter: Payer: Self-pay | Admitting: Internal Medicine

## 2019-03-15 ENCOUNTER — Other Ambulatory Visit: Payer: Self-pay

## 2019-03-15 VITALS — Ht 66.0 in | Wt 178.0 lb

## 2019-03-15 DIAGNOSIS — Z86010 Personal history of colonic polyps: Secondary | ICD-10-CM

## 2019-03-15 NOTE — Progress Notes (Signed)
Pt's previsit is done over the phone.  She did not receive prior paperwork that was mailed to her- she will come in on 03-16-19 to pick up prep instructions  Pt is aware that care partner will wait in the car during procedure; if they feel like they will be too hot or cold to wait in the car; they may wait in the 4 th floor lobby. Patient is aware to bring only one care partner. We want them to wear a mask (we do not have any that we can provide them), practice social distancing, and we will check their temperatures when they get here.  I did remind the patient that their care partner needs to stay in the parking lot the entire time and have a cell phone available, we will call them when the pt is ready for discharge. Patient will wear mask into building.  covid test 03-15-19 at 1005  No egg or soy allergy  No intubation problems per pt  Registered in [place]

## 2019-03-16 ENCOUNTER — Other Ambulatory Visit: Payer: Self-pay | Admitting: Internal Medicine

## 2019-03-16 DIAGNOSIS — Z1159 Encounter for screening for other viral diseases: Secondary | ICD-10-CM | POA: Diagnosis not present

## 2019-03-16 LAB — SARS CORONAVIRUS 2 (TAT 6-24 HRS): SARS Coronavirus 2: NEGATIVE

## 2019-03-21 ENCOUNTER — Encounter: Payer: Self-pay | Admitting: Internal Medicine

## 2019-03-21 ENCOUNTER — Other Ambulatory Visit: Payer: Self-pay

## 2019-03-21 ENCOUNTER — Ambulatory Visit (AMBULATORY_SURGERY_CENTER): Payer: PPO | Admitting: Internal Medicine

## 2019-03-21 VITALS — BP 132/48 | HR 65 | Temp 97.9°F | Resp 10 | Ht 66.0 in | Wt 178.0 lb

## 2019-03-21 DIAGNOSIS — K635 Polyp of colon: Secondary | ICD-10-CM | POA: Diagnosis not present

## 2019-03-21 DIAGNOSIS — Z86010 Personal history of colonic polyps: Secondary | ICD-10-CM

## 2019-03-21 DIAGNOSIS — Z1211 Encounter for screening for malignant neoplasm of colon: Secondary | ICD-10-CM | POA: Diagnosis not present

## 2019-03-21 MED ORDER — SODIUM CHLORIDE 0.9 % IV SOLN: 500.0000 mL | INTRAVENOUS | Status: DC

## 2019-03-21 NOTE — Op Note (Signed)
[facility] Patient Name: Karina Shelton Procedure Date: 03/21/2019 10:54 AM MRN: [id number] Endoscopist: Gatha Mayer , MD Age: 68 Referring MD:  Date of Birth: [birth year] Gender: Female Account #: [id number] Procedure:                Colonoscopy Indications:              Surveillance: Personal history of adenomatous                            polyps on last colonoscopy > 5 years ago Medicines:                Propofol per Anesthesia, Monitored Anesthesia Care Procedure:                Pre-Anesthesia Assessment:                           - Prior to the procedure, a History and Physical                            was performed, and patient medications and                            allergies were reviewed. The patient's tolerance of                            previous anesthesia was also reviewed. The risks                            and benefits of the procedure and the sedation                            options and risks were discussed with the patient.                            All questions were answered, and informed consent                            was obtained. Prior Anticoagulants: The patient has                            taken no previous anticoagulant or antiplatelet                            agents. ASA Grade Assessment: II - A patient with                            mild systemic disease. After reviewing the risks                            and benefits, the patient was deemed in                            satisfactory condition to undergo the procedure.  After obtaining informed consent, the colonoscope                            was passed under direct vision. Throughout the                            procedure, the patient's blood pressure, pulse, and                            oxygen saturations were monitored continuously. The                            Colonoscope was introduced through the anus and    advanced to the the cecum, identified by                            appendiceal orifice and ileocecal valve. The                            colonoscopy was performed without difficulty. The                            patient tolerated the procedure well. The quality                            of the bowel preparation was excellent. The bowel                            preparation used was Miralax via split dose                            instruction. The ileocecal valve, appendiceal                            orifice, and rectum were photographed. Scope In: 11:06:29 AM Scope Out: 11:17:47 AM Scope Withdrawal Time: 0 hours 8 minutes 38 seconds  Total Procedure Duration: 0 hours 11 minutes 18 seconds  Findings:                 The perianal and digital rectal examinations were                            normal.                           Two small angiodysplastic lesions were found in the                            ascending colon and in the cecum.                           External hemorrhoids were found.                           The exam was otherwise without abnormality on  direct and retroflexion views. Complications:            No immediate complications. Estimated Blood Loss:     Estimated blood loss: none. Impression:               - Two colonic angiodysplastic lesions.                           - External hemorrhoids.                           - The examination was otherwise normal on direct                            and retroflexion views.                           - No specimens collected. Recommendation:           - Patient has a contact number available for                            emergencies. The signs and symptoms of potential                            delayed complications were discussed with the                            patient. Return to normal activities tomorrow.                            Written discharge instructions were provided to  the                            patient.                           - Resume previous diet.                           - Continue present medications.                           - No repeat colonoscopy due to current age (73                            years or older) and the absence of colonic polyps. Gatha Mayer, MD 03/21/2019 11:29:45 AM This report has been signed electronically.

## 2019-03-21 NOTE — Patient Instructions (Addendum)
No polyps today  Given age and prior history of only two little polyps I am not recommending any more routine colonoscopy as next per recommendations would be 2030 and you will be 60 - and it is recommended to stop after 76 (for routine testing but not if have problems)  There were two tiny areas of small blood vessels on the surface of colon lining - that is not typically a problem. You will see these called angiodysplasia - in the colonoscopy report.  I appreciate the opportunity to care for you. Gatha Mayer, MD, [facility]  Hemorrhoid handout given to patient.  Resume previous diet. Continue present medications.  YOU HAD AN ENDOSCOPIC PROCEDURE TODAY AT [facility]:   Refer to the procedure report that was given to you for any specific questions about what was found during the examination.  If the procedure report does not answer your questions, please call your gastroenterologist to clarify.  If you requested that your care partner not be given the details of your procedure findings, then the procedure report has been included in a sealed envelope for you to review at your convenience later.  YOU SHOULD EXPECT: Some feelings of bloating in the abdomen. Passage of more gas than usual.  Walking can help get rid of the air that was put into your GI tract during the procedure and reduce the bloating. If you had a lower endoscopy (such as a colonoscopy or flexible sigmoidoscopy) you may notice spotting of blood in your stool or on the toilet paper. If you underwent a bowel prep for your procedure, you may not have a normal bowel movement for a few days.  Please Note:  You might notice some irritation and congestion in your nose or some drainage.  This is from the oxygen used during your procedure.  There is no need for concern and it should clear up in a day or so.  SYMPTOMS TO REPORT IMMEDIATELY:   Following lower endoscopy (colonoscopy or flexible sigmoidoscopy):  Excessive  amounts of blood in the stool  Significant tenderness or worsening of abdominal pains  Swelling of the abdomen that is new, acute  Fever of 100F or higher For urgent or emergent issues, a gastroenterologist can be reached at any hour by calling [phone].   DIET:  We do recommend a small meal at first, but then you may proceed to your regular diet.  Drink plenty of fluids but you should avoid alcoholic beverages for 24 hours.  ACTIVITY:  You should plan to take it easy for the rest of today and you should NOT DRIVE or use heavy machinery until tomorrow (because of the sedation medicines used during the test).    FOLLOW UP: Our staff will call the number listed on your records 48-72 hours following your procedure to check on you and address any questions or concerns that you may have regarding the information given to you following your procedure. If we do not reach you, we will leave a message.  We will attempt to reach you two times.  During this call, we will ask if you have developed any symptoms of COVID 19. If you develop any symptoms (ie: fever, flu-like symptoms, shortness of breath, cough etc.) before then, please call [phone].  If you test positive for Covid 19 in the 2 weeks post procedure, please call and report this information to [place].    If any biopsies were taken you will be contacted by phone or by  letter within the next 1-3 weeks.  Please call us at [phone] if you have not heard about the biopsies in 3 weeks.    SIGNATURES/CONFIDENTIALITY: You and/or your care partner have signed paperwork which will be entered into your electronic medical record.  These signatures attest to the fact that that the information above on your After Visit Summary has been reviewed and is understood.  Full responsibility of the confidentiality of this discharge information lies with you and/or your care-partner.

## 2019-03-21 NOTE — Progress Notes (Signed)
Temp per Lattie Haw VS per Loma Sousa  Pt's states no medical or surgical changes since previsit or office visit.

## 2019-03-21 NOTE — Progress Notes (Signed)
Pt tolerated well. VSS awake and to recovery.

## 2019-03-23 ENCOUNTER — Telehealth: Payer: Self-pay | Admitting: *Deleted

## 2019-03-23 NOTE — Telephone Encounter (Signed)
  Follow up Call-  Call back number 03/21/2019  Post procedure Call Back phone  # [phone]  Permission to leave phone message Yes  Some recent data might be hidden     Patient questions:  Do you have a fever, pain , or abdominal swelling? No. Pain Score  0 *  Have you tolerated food without any problems? Yes.    Have you been able to return to your normal activities? Yes.    Do you have any questions about your discharge instructions: Diet   No. Medications  No. Follow up visit  No.  Do you have questions or concerns about your Care? No.  Actions: * If pain score is 4 or above: No action needed, pain <4.  1. Have you developed a fever since your procedure? no  2.   Have you had an respiratory symptoms (SOB or cough) since your procedure? no  3.   Have you tested positive for COVID 19 since your procedure no  4.   Have you had any family members/close contacts diagnosed with the COVID 19 since your procedure?  no   If yes to any of these questions please route to Joylene John, RN and Alphonsa Gin, Therapist, sports.

## 2019-04-06 DIAGNOSIS — C50911 Malignant neoplasm of unspecified site of right female breast: Secondary | ICD-10-CM | POA: Diagnosis not present

## 2019-04-07 DIAGNOSIS — Z853 Personal history of malignant neoplasm of breast: Secondary | ICD-10-CM | POA: Diagnosis not present

## 2019-08-23 DIAGNOSIS — H2513 Age-related nuclear cataract, bilateral: Secondary | ICD-10-CM | POA: Diagnosis not present

## 2019-08-31 DIAGNOSIS — D485 Neoplasm of uncertain behavior of skin: Secondary | ICD-10-CM | POA: Diagnosis not present

## 2019-08-31 DIAGNOSIS — L570 Actinic keratosis: Secondary | ICD-10-CM | POA: Diagnosis not present

## 2019-08-31 DIAGNOSIS — L821 Other seborrheic keratosis: Secondary | ICD-10-CM | POA: Diagnosis not present

## 2020-04-17 ENCOUNTER — Ambulatory Visit: Payer: PPO | Attending: Internal Medicine

## 2020-04-17 ENCOUNTER — Other Ambulatory Visit (HOSPITAL_COMMUNITY): Payer: Self-pay | Admitting: Internal Medicine

## 2020-04-17 DIAGNOSIS — Z23 Encounter for immunization: Secondary | ICD-10-CM

## 2020-04-17 NOTE — Progress Notes (Signed)
   Covid-19 Vaccination Clinic  Name:  Karina Shelton    MRN: [id number] DOB: 01-02-1951  04/17/2020  [name] was observed post Covid-19 immunization for 15 minutes without incident. She was provided with Vaccine Information Sheet and instruction to access the V-Safe system.   [name] was instructed to call 911 with any severe reactions post vaccine: Marland Kitchen Difficulty breathing  . Swelling of face and throat  . A fast heartbeat  . A bad rash all over body  . Dizziness and weakness   Immunizations Administered    No immunizations on file.

## 2020-05-29 DIAGNOSIS — Z23 Encounter for immunization: Secondary | ICD-10-CM | POA: Diagnosis not present

## 2020-06-27 DIAGNOSIS — I10 Essential (primary) hypertension: Secondary | ICD-10-CM | POA: Diagnosis not present

## 2020-06-27 DIAGNOSIS — G8929 Other chronic pain: Secondary | ICD-10-CM | POA: Diagnosis not present

## 2020-06-27 DIAGNOSIS — F325 Major depressive disorder, single episode, in full remission: Secondary | ICD-10-CM | POA: Diagnosis not present

## 2020-06-27 DIAGNOSIS — F5101 Primary insomnia: Secondary | ICD-10-CM | POA: Diagnosis not present

## 2020-07-26 DIAGNOSIS — L821 Other seborrheic keratosis: Secondary | ICD-10-CM | POA: Diagnosis not present

## 2020-07-26 DIAGNOSIS — L570 Actinic keratosis: Secondary | ICD-10-CM | POA: Diagnosis not present

## 2020-07-26 DIAGNOSIS — D1801 Hemangioma of skin and subcutaneous tissue: Secondary | ICD-10-CM | POA: Diagnosis not present

## 2020-10-28 DIAGNOSIS — J019 Acute sinusitis, unspecified: Secondary | ICD-10-CM | POA: Diagnosis not present

## 2020-11-11 DIAGNOSIS — R059 Cough, unspecified: Secondary | ICD-10-CM | POA: Diagnosis not present

## 2020-11-11 DIAGNOSIS — I10 Essential (primary) hypertension: Secondary | ICD-10-CM | POA: Diagnosis not present

## 2021-01-07 DIAGNOSIS — R059 Cough, unspecified: Secondary | ICD-10-CM | POA: Diagnosis not present

## 2021-01-07 DIAGNOSIS — Z03818 Encounter for observation for suspected exposure to other biological agents ruled out: Secondary | ICD-10-CM | POA: Diagnosis not present

## 2021-01-10 ENCOUNTER — Other Ambulatory Visit: Payer: Self-pay

## 2021-01-10 ENCOUNTER — Other Ambulatory Visit: Payer: Self-pay | Admitting: Geriatric Medicine

## 2021-01-10 ENCOUNTER — Ambulatory Visit
Admission: RE | Admit: 2021-01-10 | Discharge: 2021-01-10 | Disposition: A | Discharge status: Home / Self Care | Payer: PPO | Source: Ambulatory Visit | Attending: Geriatric Medicine | Admitting: Geriatric Medicine

## 2021-01-10 DIAGNOSIS — J9801 Acute bronchospasm: Secondary | ICD-10-CM

## 2021-01-10 DIAGNOSIS — R059 Cough, unspecified: Secondary | ICD-10-CM | POA: Diagnosis not present

## 2021-01-22 DIAGNOSIS — M25512 Pain in left shoulder: Secondary | ICD-10-CM | POA: Diagnosis not present

## 2021-01-22 DIAGNOSIS — F325 Major depressive disorder, single episode, in full remission: Secondary | ICD-10-CM | POA: Diagnosis not present

## 2021-01-22 DIAGNOSIS — Z0000 Encounter for general adult medical examination without abnormal findings: Secondary | ICD-10-CM | POA: Diagnosis not present

## 2021-01-22 DIAGNOSIS — Z79899 Other long term (current) drug therapy: Secondary | ICD-10-CM | POA: Diagnosis not present

## 2021-01-22 DIAGNOSIS — Z1389 Encounter for screening for other disorder: Secondary | ICD-10-CM | POA: Diagnosis not present

## 2021-01-22 DIAGNOSIS — I10 Essential (primary) hypertension: Secondary | ICD-10-CM | POA: Diagnosis not present

## 2021-01-22 DIAGNOSIS — G479 Sleep disorder, unspecified: Secondary | ICD-10-CM | POA: Diagnosis not present

## 2021-01-22 DIAGNOSIS — Z23 Encounter for immunization: Secondary | ICD-10-CM | POA: Diagnosis not present

## 2021-01-22 DIAGNOSIS — F331 Major depressive disorder, recurrent, moderate: Secondary | ICD-10-CM | POA: Diagnosis not present

## 2021-01-22 DIAGNOSIS — Z1159 Encounter for screening for other viral diseases: Secondary | ICD-10-CM | POA: Diagnosis not present

## 2021-01-22 DIAGNOSIS — M25511 Pain in right shoulder: Secondary | ICD-10-CM | POA: Diagnosis not present

## 2021-03-07 NOTE — Progress Notes (Signed)
[facility]  [address] [place],  [place]  [address] [phone]  Clinic Day:  03/13/2021  Referring physician: Lajean Manes, MD  This document serves as a record of services personally performed by [name], MD. It was created on their behalf by Curry,Lauren E, a trained medical scribe. The creation of this record is based on the scribe's personal observations and the provider's statements to them.  ASSESSMENT & PLAN:   Triple negative stage IIB right breast cancer, diagnosed in February 2000, treated with surgery, chemotherapy, and radiation.  She has no evidence of disease now over 22 and 1/2 years postop.    As she continues to do well, I can see her back in 1 year with CBC and comprehensive metabolic profile.  She understands and agrees with this plan of care.   I provided 15 minutes of face-to-face time during this this encounter and > 50% was spent counseling as documented under my assessment and plan.    Derwood Kaplan, MD [facility] [address] [place] [address] Dept: [phone] Dept Fax: [phone]   CHIEF COMPLAINT:  CC: History of triple negative stage IIB right breast cancer  Current Treatment:  Surveillance   HISTORY OF PRESENT ILLNESS:  Karina Shelton is a 70 y.o. female with a history of stage IIB breast cancer diagnosed in February of 2000. This was a T3 N1 M0, treated with a right mastectomy and was triple negative.  She received 6 months of adjuvant chemotherapy which included 3 months of doxorubicin and cyclophosphamide followed by 3 months of paclitaxel.  She later had a left mastectomy as well and has had a high anxiety of recurrence because her mother died of metastatic breast cancer.  She had a tubular adenoma on colonoscopy in Nov 10, 2012 and 2 sessile polyps and so they plan to repeat that at 5 years.  She has  retired now.   She had a chest x-ray last year which was clear.  She is on tramadol for pain as needed and trazodone 50 mg at bedtime,  Biotin 1 tablet daily, doxylamine (Unisom) 50 mg at bedtime, fish oil 1000 mg daily, melatonin at bedtime, Wellbutrin daily, and vitamin-C 500 mg daily.  She is on aspirin 81 mg daily in addition to her usual medications.  Her husband has had cardiac issues and expired in 2020/11/10.    INTERVAL HISTORY:  I have reviewed her chart and materials related to her cancer extensively and collaborated history with the patient. Summary of oncologic history is as follows: Oncology History   No history exists.    Karina Shelton is here for follow up after not being seen since November 2020. She states that she has been well and denies complaints. She has had a few falls while renovating her house but sustained no serious injury other than strains. Unfortunately, her husband passed away in 11-Nov-2022 this year. Blood counts and chemistries are unremarkable. Her  appetite is good, and her weight is stable since her last visit.  She denies fever, chills or other signs of infection.  She denies nausea, vomiting, bowel issues, or abdominal pain.  She denies sore throat, cough, dyspnea, or chest pain.  HISTORY:   Allergies:  Allergies  Allergen Reactions   Codeine Itching    Current Medications: Current Outpatient Medications  Medication Sig Dispense Refill   aspirin EC 81 MG tablet Take 81 mg by  mouth daily. Taking 325 mg at this time     BIOTIN PO Take 10,000 mcg by mouth daily.     buPROPion (WELLBUTRIN XL) 300 MG 24 hr tablet      Cyanocobalamin (VITAMIN B-12 PO) Take 1,000 mg by mouth daily.     doxylamine, Sleep, (UNISOM) 25 MG tablet Take 25 mg by mouth at bedtime as needed for sleep.     OVER THE COUNTER MEDICATION Back and muscle reliever-over the counter     traMADol (ULTRAM) 50 MG tablet Take 50-100 mg by mouth every 6 (six) hours as needed. For pain     traZODone (DESYREL)  50 MG tablet Take 50-100 mg by mouth at bedtime.     vitamin C (ASCORBIC ACID) 500 MG tablet Take 1,000 mg by mouth daily.     No current facility-administered medications for this visit.    REVIEW OF SYSTEMS:  Review of Systems  Constitutional: Negative.  Negative for appetite change, chills, fatigue, fever and unexpected weight change.  HENT:  Negative.    Eyes: Negative.   Respiratory: Negative.  Negative for chest tightness, cough, hemoptysis, shortness of breath and wheezing.   Cardiovascular: Negative.  Negative for chest pain, leg swelling and palpitations.  Gastrointestinal: Negative.  Negative for abdominal distention, abdominal pain, blood in stool, constipation, diarrhea, nausea and vomiting.  Endocrine: Negative.   Genitourinary: Negative.  Negative for difficulty urinating, dysuria, frequency and hematuria.   Musculoskeletal: Negative.  Negative for arthralgias, back pain, flank pain, gait problem and myalgias.  Skin: Negative.   Neurological: Negative.  Negative for dizziness, extremity weakness, gait problem, headaches, light-headedness, numbness, seizures and speech difficulty.  Hematological: Negative.   Psychiatric/Behavioral: Negative.  Negative for depression and sleep disturbance. The patient is not nervous/anxious.      VITALS:  Blood pressure 134/75, pulse 92, temperature 98.2 F (36.8 C), temperature source Oral, resp. rate 18, height 5\' 6"  (1.676 m), weight 180 lb 4.8 oz (81.8 kg), SpO2 97 %.  Wt Readings from Last 3 Encounters:  03/13/21 180 lb 4.8 oz (81.8 kg)  03/21/19 178 lb (80.7 kg)  03/15/19 178 lb (80.7 kg)    Body mass index is 29.1 kg/m.  Performance status (ECOG): 0 - Asymptomatic  PHYSICAL EXAM:  Physical Exam Constitutional:      General: She is not in acute distress.    Appearance: Normal appearance. She is normal weight.  HENT:     Head: Normocephalic and atraumatic.  Eyes:     General: No scleral icterus.    Extraocular Movements:  Extraocular movements intact.     Conjunctiva/sclera: Conjunctivae normal.     Pupils: Pupils are equal, round, and reactive to light.  Cardiovascular:     Rate and Rhythm: Normal rate and regular rhythm.     Pulses: Normal pulses.     Heart sounds: Normal heart sounds. No murmur heard.   No friction rub. No gallop.  Pulmonary:     Effort: Pulmonary effort is normal. No respiratory distress.     Breath sounds: Normal breath sounds.  Chest:  Breasts:    Right: Absent.     Left: Absent.     Comments: Bilateral mastectomies are negative. She has extensive fibrosis on the right with firmness of the axillary tendon. Abdominal:     General: Bowel sounds are normal. There is no distension.     Palpations: Abdomen is soft. There is no hepatomegaly, splenomegaly or mass.     Tenderness: There is no abdominal  tenderness.  Musculoskeletal:        General: Normal range of motion.     Cervical back: Normal range of motion and neck supple.     Right lower leg: No edema.     Left lower leg: No edema.  Lymphadenopathy:     Cervical: No cervical adenopathy.  Skin:    General: Skin is warm and dry.  Neurological:     General: No focal deficit present.     Mental Status: She is alert and oriented to person, place, and time. Mental status is at baseline.  Psychiatric:        Mood and Affect: Mood normal.        Behavior: Behavior normal.        Thought Content: Thought content normal.        Judgment: Judgment normal.    LABS:   CBC Latest Ref Rng & Units 09/21/2015 09/21/2015 02/06/2012  WBC 4.0 - 10.5 K/uL - 5.6 4.6  Hemoglobin 12.0 - 15.0 g/dL 14.6 13.0 12.9  Hematocrit 36.0 - 46.0 % 43.0 39.9 38.8  Platelets 150 - 400 K/uL - 270 210   CMP Latest Ref Rng & Units 09/21/2015 09/21/2015 02/06/2012  Glucose 65 - 99 mg/dL 120(H) 132(H) 91  BUN 6 - 20 mg/dL 15 11 9   Creatinine 0.44 - 1.00 mg/dL 1.00 1.03(H) 0.65  Sodium 135 - 145 mmol/L 143 144 140  Potassium 3.5 - 5.1 mmol/L 4.4 4.2 3.4(L)   Chloride 101 - 111 mmol/L 103 107 105  CO2 22 - 32 mmol/L - 29 30  Calcium 8.9 - 10.3 mg/dL - 9.1 9.1  Total Protein 6.5 - 8.1 g/dL - 6.2(L) 6.3  Total Bilirubin 0.3 - 1.2 mg/dL - 0.4 0.3  Alkaline Phos 38 - 126 U/L - 94 91  AST 15 - 41 U/L - 23 19  ALT 14 - 54 U/L - 22 17     STUDIES:  No results found.      I, Rita Ohara, am acting as scribe for Derwood Kaplan, MD  I have reviewed this report as typed by the medical scribe, and it is complete and accurate.

## 2021-03-12 ENCOUNTER — Other Ambulatory Visit: Payer: Self-pay | Admitting: Oncology

## 2021-03-13 ENCOUNTER — Inpatient Hospital Stay: Payer: PPO

## 2021-03-13 ENCOUNTER — Other Ambulatory Visit: Payer: Self-pay | Admitting: Oncology

## 2021-03-13 ENCOUNTER — Other Ambulatory Visit: Payer: Self-pay | Admitting: Hematology and Oncology

## 2021-03-13 ENCOUNTER — Inpatient Hospital Stay: Payer: PPO | Attending: Oncology | Admitting: Oncology

## 2021-03-13 ENCOUNTER — Telehealth: Payer: Self-pay

## 2021-03-13 ENCOUNTER — Encounter: Payer: Self-pay | Admitting: Oncology

## 2021-03-13 VITALS — BP 134/75 | HR 92 | Temp 98.2°F | Resp 18 | Ht 66.0 in | Wt 180.3 lb

## 2021-03-13 DIAGNOSIS — Z171 Estrogen receptor negative status [ER-]: Secondary | ICD-10-CM

## 2021-03-13 DIAGNOSIS — C50911 Malignant neoplasm of unspecified site of right female breast: Secondary | ICD-10-CM

## 2021-03-13 DIAGNOSIS — D649 Anemia, unspecified: Secondary | ICD-10-CM | POA: Diagnosis not present

## 2021-03-13 DIAGNOSIS — Z86010 Personal history of colonic polyps: Secondary | ICD-10-CM

## 2021-03-13 LAB — CBC AND DIFFERENTIAL
HCT: 41 (ref 36–46)
Hemoglobin: 13.7 (ref 12.0–16.0)
Neutrophils Absolute: 3.42
Platelets: 266 (ref 150–399)
WBC: 6

## 2021-03-13 LAB — BASIC METABOLIC PANEL
BUN: 17 (ref 4–21)
CO2: 26 — AB (ref 13–22)
Chloride: 106 (ref 99–108)
Creatinine: 0.7 (ref 0.5–1.1)
Glucose: 106
Potassium: 3.9 (ref 3.4–5.3)
Sodium: 140 (ref 137–147)

## 2021-03-13 LAB — HEPATIC FUNCTION PANEL
ALT: 20 (ref 7–35)
AST: 33 (ref 13–35)
Alkaline Phosphatase: 135 — AB (ref 25–125)
Bilirubin, Total: 0.5

## 2021-03-13 LAB — COMPREHENSIVE METABOLIC PANEL
Albumin: 3.9 (ref 3.5–5.0)
Calcium: 8.7 (ref 8.7–10.7)

## 2021-03-13 LAB — CBC
MCV: 86 (ref 81–99)
RBC: 4.72 (ref 3.87–5.11)

## 2021-03-13 NOTE — Telephone Encounter (Signed)
-----   Message from Derwood Kaplan, MD sent at 03/13/2021  4:35 PM EDT ----- Regarding: call Tell her rest of labs look great, incl BS 106

## 2021-03-13 NOTE — Telephone Encounter (Signed)
Patient notified

## 2021-08-04 DIAGNOSIS — H2513 Age-related nuclear cataract, bilateral: Secondary | ICD-10-CM | POA: Diagnosis not present

## 2021-08-11 DIAGNOSIS — L820 Inflamed seborrheic keratosis: Secondary | ICD-10-CM | POA: Diagnosis not present

## 2021-08-11 DIAGNOSIS — L814 Other melanin hyperpigmentation: Secondary | ICD-10-CM | POA: Diagnosis not present

## 2021-08-11 DIAGNOSIS — L821 Other seborrheic keratosis: Secondary | ICD-10-CM | POA: Diagnosis not present

## 2021-08-11 DIAGNOSIS — D1801 Hemangioma of skin and subcutaneous tissue: Secondary | ICD-10-CM | POA: Diagnosis not present

## 2021-10-27 DIAGNOSIS — G4700 Insomnia, unspecified: Secondary | ICD-10-CM | POA: Diagnosis not present

## 2021-10-27 DIAGNOSIS — Z131 Encounter for screening for diabetes mellitus: Secondary | ICD-10-CM | POA: Diagnosis not present

## 2021-10-27 DIAGNOSIS — N959 Unspecified menopausal and perimenopausal disorder: Secondary | ICD-10-CM | POA: Diagnosis not present

## 2021-10-27 DIAGNOSIS — G8929 Other chronic pain: Secondary | ICD-10-CM | POA: Diagnosis not present

## 2021-10-27 DIAGNOSIS — Z13220 Encounter for screening for lipoid disorders: Secondary | ICD-10-CM | POA: Diagnosis not present

## 2021-10-27 DIAGNOSIS — E538 Deficiency of other specified B group vitamins: Secondary | ICD-10-CM | POA: Diagnosis not present

## 2021-10-27 DIAGNOSIS — F324 Major depressive disorder, single episode, in partial remission: Secondary | ICD-10-CM | POA: Diagnosis not present

## 2022-01-28 DIAGNOSIS — G8929 Other chronic pain: Secondary | ICD-10-CM | POA: Diagnosis not present

## 2022-01-28 DIAGNOSIS — F324 Major depressive disorder, single episode, in partial remission: Secondary | ICD-10-CM | POA: Diagnosis not present

## 2022-01-28 DIAGNOSIS — R002 Palpitations: Secondary | ICD-10-CM | POA: Diagnosis not present

## 2022-01-28 DIAGNOSIS — Z6830 Body mass index (BMI) 30.0-30.9, adult: Secondary | ICD-10-CM | POA: Diagnosis not present

## 2022-01-28 DIAGNOSIS — G4700 Insomnia, unspecified: Secondary | ICD-10-CM | POA: Diagnosis not present

## 2022-02-10 DIAGNOSIS — E785 Hyperlipidemia, unspecified: Secondary | ICD-10-CM | POA: Diagnosis not present

## 2022-02-10 DIAGNOSIS — E669 Obesity, unspecified: Secondary | ICD-10-CM | POA: Diagnosis not present

## 2022-02-10 DIAGNOSIS — Z0000 Encounter for general adult medical examination without abnormal findings: Secondary | ICD-10-CM | POA: Diagnosis not present

## 2022-02-10 DIAGNOSIS — Z6830 Body mass index (BMI) 30.0-30.9, adult: Secondary | ICD-10-CM | POA: Diagnosis not present

## 2022-02-10 DIAGNOSIS — Z1331 Encounter for screening for depression: Secondary | ICD-10-CM | POA: Diagnosis not present

## 2022-02-11 ENCOUNTER — Encounter: Payer: Self-pay | Admitting: *Deleted

## 2022-02-11 ENCOUNTER — Encounter: Payer: Self-pay | Admitting: Cardiology

## 2022-02-17 ENCOUNTER — Encounter: Payer: Self-pay | Admitting: Cardiology

## 2022-02-17 ENCOUNTER — Ambulatory Visit: Payer: PPO | Attending: Cardiology

## 2022-02-17 ENCOUNTER — Ambulatory Visit: Payer: PPO | Attending: Cardiology | Admitting: Cardiology

## 2022-02-17 VITALS — BP 136/86 | HR 86 | Ht 66.0 in | Wt 185.4 lb

## 2022-02-17 DIAGNOSIS — Z171 Estrogen receptor negative status [ER-]: Secondary | ICD-10-CM | POA: Diagnosis not present

## 2022-02-17 DIAGNOSIS — Z8249 Family history of ischemic heart disease and other diseases of the circulatory system: Secondary | ICD-10-CM

## 2022-02-17 DIAGNOSIS — R0609 Other forms of dyspnea: Secondary | ICD-10-CM

## 2022-02-17 DIAGNOSIS — R002 Palpitations: Secondary | ICD-10-CM | POA: Insufficient documentation

## 2022-02-17 DIAGNOSIS — C50911 Malignant neoplasm of unspecified site of right female breast: Secondary | ICD-10-CM | POA: Diagnosis not present

## 2022-02-17 DIAGNOSIS — E785 Hyperlipidemia, unspecified: Secondary | ICD-10-CM

## 2022-02-17 NOTE — Progress Notes (Unsigned)
Cardiology Consultation:    Date:  02/17/2022   ID:  Karina Shelton, DOB [birth year], MRN [id number]  PCP:  Marga Hoots, NP  Cardiologist:  Jenne Campus, MD   Referring MD: Carvel Getting Key, *   Chief Complaint  Patient presents with   elevated HR    150's   Fatigue    Ongoing for several months    History of Present Illness:    Karina Shelton is a 71 y.o. female who is being seen today for the evaluation of palpitations at the request of Carvel Getting Key, *.  Past medical history significant for dyslipidemia, breast cancer initially diagnosed in 2000 being cancer free for all this years.  She was referred to [place] because of palpitations.  She does have Apple Watch and she did notice that her heart will speed up.  Typically happen either with emotions or with exertion.  Recently she she end up going to [place] in [place] she had to walk a lot when she did that she got very tired and exhausted.  She described also to have some chest pain pain is located on the left side of her chest placed when she had mastectomy done and she did have bilateral mastectomy, pain is sharp stabbing brief lasting only for split-second not related to exercise.  Very atypical.  She does have dyslipidemia.  She smoked for very short.  Time when she was teenager, there is no alcohol.  Last year she lost her husband to multiple diseases.  She described the fact that she is very tired and exhausted she does not have energy she does not have any willingness to do any effort.  She likes coiling but cannot do it right now because she just too exhausted.  She never passed out does not get dizzy  Past Medical History:  Diagnosis Date   Anemia    when pregnant   Breast cancer ([place]) 2000   right    Chronic pain    Depression    Fainting    x3   Insomnia    Kidney stone    Personal history of colonic adenomas 10/19/2012    Past Surgical History:  Procedure Laterality Date    CHOLECYSTECTOMY  1983   COLONOSCOPY     MASTECTOMY  2000&2001   bil.   PARTIAL HYSTERECTOMY     TOTAL ABDOMINAL HYSTERECTOMY  2011    Current Medications: Current Meds  Medication Sig   acetaminophen (TYLENOL) 500 MG tablet Take 500 mg by mouth every 6 (six) hours as needed for mild pain or moderate pain.   Ascorbic Acid (VITAMIN C PO) Take 1 tablet by mouth daily.   aspirin EC 81 MG tablet Take 325 mg by mouth daily. Swallow whole.   buPROPion (WELLBUTRIN XL) 300 MG 24 hr tablet Take 300 mg by mouth daily.   Calcium Carb-Cholecalciferol (CALCIUM PLUS VITAMIN D3) 600-20 MG-MCG TABS Take 1 tablet by mouth daily.   Cyanocobalamin (B12 LIQUID HEALTH BOOSTER PO) Take 15 mLs by mouth daily.   Cyanocobalamin (VITAMIN B-12 PO) Take 1,000 mg by mouth daily.   doxylamine, Sleep, (UNISOM) 25 MG tablet Take 25 mg by mouth at bedtime as needed for sleep.   traZODone (DESYREL) 50 MG tablet Take 50 mg by mouth at bedtime. 30 minutes before bed   [DISCONTINUED] loratadine (CLARITIN) 10 MG tablet Take 10 mg by mouth daily.   [DISCONTINUED] QUEtiapine (SEROQUEL) 25 MG tablet Take 25 mg by mouth  at bedtime.   [DISCONTINUED] Turmeric (QC TUMERIC COMPLEX PO) Take 500 mg by mouth daily.     Allergies:   Patient has no known allergies.   Social History   Socioeconomic History   Marital status: Married    Spouse name: Not on file   Number of children: Not on file   Years of education: Not on file   Highest education level: Not on file  Occupational History   Not on file  Tobacco Use   Smoking status: Former    Types: Cigarettes    Quit date: 17    Years since quitting: 50.7   Smokeless tobacco: Never  Vaping Use   Vaping Use: Never used  Substance and Sexual Activity   Alcohol use: Yes    Comment: occasional only during events   Drug use: No   Sexual activity: Not on file  Other Topics Concern   Not on file  Social History Narrative   Not on file   Social Determinants of Health    Financial Resource Strain: Not on file  Food Insecurity: Not on file  Transportation Needs: Not on file  Physical Activity: Not on file  Stress: Not on file  Social Connections: Not on file     Family History: The patient's family history includes Breast cancer in her mother; Cancer in her father. There is no history of Colon cancer, Esophageal cancer, Rectal cancer, or Stomach cancer. ROS:   Please see the history of present illness.    All 14 point review of systems negative except as described per history of present illness.  EKGs/Labs/Other Studies Reviewed:    The following studies were reviewed today:   EKG:  EKG is  ordered today.  The ekg ordered today demonstrates normal sinus rhythm, normal P interval, left ventricle hypertrophy, nonspecific ST segment changes Recent Labs: 03/13/2021: ALT 20; BUN 17; Creatinine 0.7; Hemoglobin 13.7; Platelets 266; Potassium 3.9; Sodium 140  Recent Lipid Panel No results found for: "CHOL", "TRIG", "HDL", "CHOLHDL", "VLDL", "LDLCALC", "LDLDIRECT"  Physical Exam:    VS:  BP 136/86 (BP Location: Left Arm, Patient Position: Sitting)   Pulse 86   Ht '5\' 6"'$  (1.676 m)   Wt 185 lb 6.4 oz (84.1 kg)   SpO2 95%   BMI 29.92 kg/m     Wt Readings from Last 3 Encounters:  02/17/22 185 lb 6.4 oz (84.1 kg)  01/28/22 183 lb (83 kg)  03/13/21 180 lb 4.8 oz (81.8 kg)     GEN:  Well nourished, well developed in no acute distress HEENT: Normal NECK: No JVD; No carotid bruits LYMPHATICS: No lymphadenopathy CARDIAC: RRR, no murmurs, no rubs, no gallops RESPIRATORY:  Clear to auscultation without rales, wheezing or rhonchi  ABDOMEN: Soft, non-tender, non-distended MUSCULOSKELETAL:  No edema; No deformity  SKIN: Warm and dry NEUROLOGIC:  Alert and oriented x 3 PSYCHIATRIC:  Normal affect   ASSESSMENT:    1. Palpitations   2. Dyspnea on exertion   3. Dyslipidemia    PLAN:    In order of problems listed above:  Palpitations: I will ask  her to wear Zio patch for 2 weeks to see exactly what arrhythmia we dealing with.  I am worried that that may be some component of deconditioning as well as some component of anxiety.  That is the case then beta-blocker will be perfect choice but I will not like to start yet until have better understanding what the problem is.  As a part of  evaluation she will have also echocardiogram done to assess left ventricle ejection fraction. Dyspnea on exertion again could be deconditioning but echocardiogram will be done to assess left ventricle ejection fraction. This edema I did calculate her 10 years predicted risk which is at 9.4 which is intermediate.  We initiated conversation about potentially starting some medications.  However we reach conclusion that she will have a calcium score done and based on results of calcium score we will decide what to do.  I did review K PN which show me her LDL at 108 HDL 63 this is from summer 2021, therefore if we decided to start therapy cholesterol need to be repeated. I suspect part of her symptomatology could be related to anxiety and depression as well but that have to be diagnosis of exclusion's, therefore above-mentioned test will be performed first.   Medication Adjustments/Labs and Tests Ordered: Current medicines are reviewed at length with the patient today.  Concerns regarding medicines are outlined above.  No orders of the defined types were placed in this encounter.  No orders of the defined types were placed in this encounter.   Signed, Park Liter, MD, [facility]. 02/17/2022 9:09 AM    [facility]

## 2022-02-17 NOTE — Patient Instructions (Signed)
Medication Instructions:  Your physician recommends that you continue on your current medications as directed. Please refer to the Current Medication list given to you today.  *If you need a refill on your cardiac medications before your next appointment, please call your pharmacy*   Lab Work: None Ordered If you have labs (blood work) drawn today and your tests are completely normal, you will receive your results only by: [place] (if you have MyChart) OR A paper copy in the mail If you have any lab test that is abnormal or we need to change your treatment, we will call you to review the results.   Testing/Procedures: We will order CT coronary calcium score. It will cost $99.00 and is not covered by insurance.  Please call to schedule.    [facility] [address], [place] [address] [phone] [place] DOCTOR PRESCRIBING ZIO? The Zio system is proven and trusted by physicians to detect and diagnose irregular heart rhythms -- and has been prescribed to hundreds of thousands of patients.  The FDA has cleared the Zio system to monitor for many different kinds of irregular heart rhythms. In a study, physicians were able to reach a diagnosis 90% of the time with the Zio system1.  You can wear the Zio monitor -- a small, discreet, comfortable patch -- during your normal day-to-day activity, including while you sleep, shower, and exercise, while it records every single heartbeat for analysis.  1Barrett, P., et al. Comparison of 24 Hour Holter Monitoring Versus 14 Day Novel Adhesive Patch Electrocardiographic Monitoring. [place], 2014.  ZIO VS. HOLTER MONITORING The Zio monitor can be comfortably worn for up to 14 days. Holter monitors can be worn for 24 to 48 hours, limiting the time to record any irregular heart rhythms you may have. Zio is able to capture data for the 51% of patients who have their first symptom-triggered  arrhythmia after 48 hours.1  LIVE WITHOUT RESTRICTIONS The Zio ambulatory cardiac monitor is a small, unobtrusive, and water-resistant patch--you might even forget you're wearing it. The Zio monitor records and stores every beat of your heart, whether you're sleeping, working out, or showering.     Your physician has requested that you have an echocardiogram. Echocardiography is a painless test that uses sound waves to create images of your heart. It provides your doctor with information about the size and shape of your heart and how well your heart's chambers and valves are working. This procedure takes approximately one hour. There are no restrictions for this procedure.   Follow-Up: At [facility], you and your health needs are our priority.  As part of our continuing mission to provide you with exceptional heart care, we have created designated Provider Care Teams.  These Care Teams include your primary Cardiologist (physician) and Advanced Practice Providers (APPs -  Physician Assistants and Nurse Practitioners) who all work together to provide you with the care you need, when you need it.  We recommend signing up for the patient portal called "MyChart".  Sign up information is provided on this After Visit Summary.  MyChart is used to connect with patients for Virtual Visits (Telemedicine).  Patients are able to view lab/test results, encounter notes, upcoming appointments, etc.  Non-urgent messages can be sent to your provider as well.   To learn more about what you can do with MyChart, go to [email or web address].    Your next appointment:   3 month(s)  The  format for your next appointment:   In Person  Provider:   Jenne Campus, MD    Other Instructions NA

## 2022-02-24 DIAGNOSIS — J209 Acute bronchitis, unspecified: Secondary | ICD-10-CM | POA: Diagnosis not present

## 2022-02-24 DIAGNOSIS — Z20822 Contact with and (suspected) exposure to covid-19: Secondary | ICD-10-CM | POA: Diagnosis not present

## 2022-02-24 DIAGNOSIS — J302 Other seasonal allergic rhinitis: Secondary | ICD-10-CM | POA: Diagnosis not present

## 2022-02-27 ENCOUNTER — Ambulatory Visit (HOSPITAL_BASED_OUTPATIENT_CLINIC_OR_DEPARTMENT_OTHER): Payer: PPO | Attending: Cardiology

## 2022-02-27 ENCOUNTER — Ambulatory Visit (HOSPITAL_BASED_OUTPATIENT_CLINIC_OR_DEPARTMENT_OTHER): Payer: PPO

## 2022-02-27 DIAGNOSIS — R0609 Other forms of dyspnea: Secondary | ICD-10-CM

## 2022-02-27 LAB — ECHOCARDIOGRAM COMPLETE
Area-P 1/2: 4.89 cm2
MV M vel: 5.77 m/s
MV Peak grad: 133.2 mmHg
Radius: 0.3 cm
S' Lateral: 3.1 cm

## 2022-03-09 ENCOUNTER — Telehealth: Payer: Self-pay

## 2022-03-09 ENCOUNTER — Ambulatory Visit (HOSPITAL_BASED_OUTPATIENT_CLINIC_OR_DEPARTMENT_OTHER)
Admission: RE | Admit: 2022-03-09 | Discharge: 2022-03-09 | Disposition: A | Discharge status: Home / Self Care | Payer: PPO | Source: Ambulatory Visit | Attending: Cardiology | Admitting: Cardiology

## 2022-03-09 DIAGNOSIS — Z8249 Family history of ischemic heart disease and other diseases of the circulatory system: Secondary | ICD-10-CM | POA: Insufficient documentation

## 2022-03-09 NOTE — Telephone Encounter (Signed)
Results reviewed with pt as per Dr. Krasowski's note.  Pt verbalized understanding and had no additional questions. Routed to PCP  

## 2022-03-11 DIAGNOSIS — Z6831 Body mass index (BMI) 31.0-31.9, adult: Secondary | ICD-10-CM | POA: Diagnosis not present

## 2022-03-11 DIAGNOSIS — F324 Major depressive disorder, single episode, in partial remission: Secondary | ICD-10-CM | POA: Diagnosis not present

## 2022-03-11 DIAGNOSIS — R002 Palpitations: Secondary | ICD-10-CM | POA: Diagnosis not present

## 2022-03-11 DIAGNOSIS — G4700 Insomnia, unspecified: Secondary | ICD-10-CM | POA: Diagnosis not present

## 2022-03-11 DIAGNOSIS — R632 Polyphagia: Secondary | ICD-10-CM | POA: Diagnosis not present

## 2022-03-12 ENCOUNTER — Telehealth: Payer: Self-pay

## 2022-03-12 NOTE — Telephone Encounter (Signed)
-----   Message from Park Liter, MD sent at 03/11/2022  9:52 AM EDT ----- Calcium score 0, this is very good news.  No extracardiac pathology.

## 2022-03-12 NOTE — Telephone Encounter (Signed)
Patient notified of results.

## 2022-03-13 ENCOUNTER — Ambulatory Visit: Payer: PPO | Admitting: Oncology

## 2022-03-13 ENCOUNTER — Other Ambulatory Visit: Payer: PPO

## 2022-03-24 DIAGNOSIS — L820 Inflamed seborrheic keratosis: Secondary | ICD-10-CM | POA: Diagnosis not present

## 2022-03-24 DIAGNOSIS — D2239 Melanocytic nevi of other parts of face: Secondary | ICD-10-CM | POA: Diagnosis not present

## 2022-03-31 ENCOUNTER — Other Ambulatory Visit: Payer: Self-pay | Admitting: Oncology

## 2022-03-31 DIAGNOSIS — C50911 Malignant neoplasm of unspecified site of right female breast: Secondary | ICD-10-CM

## 2022-03-31 NOTE — Progress Notes (Signed)
[facility]  [address] [place],  [place]  [id number] [phone]  Clinic Day: 04/01/22  Referring physician: Lajean Manes, MD  ASSESSMENT & PLAN:   Triple negative stage IIB right breast cancer, diagnosed in February 2000, treated with surgery, chemotherapy, and radiation.  She has no evidence of disease now over 23 1/2 years postop.    As she continues to do well, I can see her back in 1 year with CBC and comprehensive metabolic profile.  She understands and agrees with this plan of care.   I provided 15 minutes of face-to-face time during this this encounter and > 50% was spent counseling as documented under my assessment and plan.    Karina Kaplan, MD [facility] [address] [place] [address] Dept: [phone] Dept Fax: [phone]   CHIEF COMPLAINT:  CC: History of triple negative stage IIB right breast cancer  Current Treatment:  Surveillance   HISTORY OF PRESENT ILLNESS:  Karina Shelton is a 71 y.o. female with a history of stage IIB breast cancer diagnosed in February of 2000. This was a T3 N1 M0, treated with a right mastectomy and was triple negative.  She received 6 months of adjuvant chemotherapy which included 3 months of doxorubicin and cyclophosphamide followed by 3 months of paclitaxel.  She later had a left mastectomy as well and has had a high anxiety of recurrence because her mother died of metastatic breast cancer.  She had a tubular adenoma on colonoscopy in October 29, 2012 and 2 sessile polyps and so they plan to repeat that at 5 years.  She has retired now.   She had a chest x-ray last year which was clear.  She is on tramadol for pain as needed and trazodone 50 mg at bedtime,  Biotin 1 tablet daily, doxylamine (Unisom) 50 mg at bedtime, fish oil 1000 mg daily, melatonin at bedtime, Wellbutrin daily, and vitamin-C 500 mg daily.  She is  on aspirin 81 mg daily in addition to her usual medications.  Her husband has had cardiac issues and expired in 29-Oct-2020.    INTERVAL HISTORY:  I have reviewed her chart and materials related to her cancer extensively and collaborated history with the patient. Summary of oncologic history is as follows: Oncology History  Breast cancer ([place])  06/19/1998 Cancer Staging   Staging form: Breast, AJCC 6th Edition - Clinical stage from 06/19/1998: Stage IIIA (T3, N1, M0) - Signed by Karina Kaplan, MD on 03/21/2021 Staged by: Managing physician Diagnostic confirmation: Positive histology Specimen type: Excision Histopathologic type: Infiltrating duct carcinoma, NOS Laterality: Right Histologic grade (G): G3 Residual tumor (R): R0 - None Stage prefix: Initial diagnosis Lymphatic vessel invasion (L): L0 - No lymphatic vessel invasion Venous invasion (V): VX - Cannot be assessed Prognostic indicators: Triple negative.  Had right mast and later left mast.  She had adjuvant chemo with AC x 4 followed by 3 months of Taxol   07/02/1998 Initial Diagnosis   Breast cancer ([place])     [name] is here for annual follow up. She states that she has been well and denies complaints. Unfortunately, her husband passed away in 30-Oct-2022 last year. Blood counts and chemistries are unremarkable. Her  appetite is good, and her weight is up 5 pounds since her last visit. She denies fever, chills or other signs of infection. She denies nausea, vomiting, bowel issues, or abdominal pain.  She denies sore throat, cough, dyspnea, or chest pain.  HISTORY:   Allergies:  No Known Allergies   Current Medications: Current Outpatient Medications  Medication Sig Dispense Refill   metoprolol tartrate (LOPRESSOR) 25 MG tablet Take 1 tablet (25 mg total) by mouth 2 (two) times daily. 180 tablet 3   acetaminophen (TYLENOL) 500 MG tablet Take 500 mg by mouth every 6 (six) hours as needed for mild pain or moderate pain.     Ascorbic  Acid (VITAMIN C PO) Take 1 tablet by mouth daily.     aspirin EC 81 MG tablet Take 325 mg by mouth daily. Swallow whole.     buPROPion (WELLBUTRIN XL) 300 MG 24 hr tablet Take 300 mg by mouth daily.     Calcium Carb-Cholecalciferol (CALCIUM PLUS VITAMIN D3) 600-20 MG-MCG TABS Take 1 tablet by mouth daily.     Cyanocobalamin (B12 LIQUID HEALTH BOOSTER PO) Take 15 mLs by mouth daily.     Cyanocobalamin (VITAMIN B-12 PO) Take 1,000 mg by mouth daily.     doxylamine, Sleep, (UNISOM) 25 MG tablet Take 25 mg by mouth at bedtime as needed for sleep.     traZODone (DESYREL) 50 MG tablet Take 50 mg by mouth at bedtime. 30 minutes before bed     No current facility-administered medications for this visit.    REVIEW OF SYSTEMS:  Review of Systems  Constitutional: Negative.  Negative for appetite change, chills, fatigue, fever and unexpected weight change.  HENT:  Negative.    Eyes: Negative.   Respiratory: Negative.  Negative for chest tightness, cough, hemoptysis, shortness of breath and wheezing.   Cardiovascular: Negative.  Negative for chest pain, leg swelling and palpitations.  Gastrointestinal: Negative.  Negative for abdominal distention, abdominal pain, blood in stool, constipation, diarrhea, nausea and vomiting.  Endocrine: Negative.   Genitourinary: Negative.  Negative for difficulty urinating, dysuria, frequency and hematuria.   Musculoskeletal: Negative.  Negative for arthralgias, back pain, flank pain, gait problem and myalgias.  Skin: Negative.   Neurological: Negative.  Negative for dizziness, extremity weakness, gait problem, headaches, light-headedness, numbness, seizures and speech difficulty.  Hematological: Negative.   Psychiatric/Behavioral: Negative.  Negative for depression and sleep disturbance. The patient is not nervous/anxious.       VITALS:  Blood pressure 138/85, pulse 70, temperature 98 F (36.7 C), temperature source Oral, resp. rate 16, height '5\' 6"'$  (1.676 m),  weight 190 lb 9.6 oz (86.5 kg), SpO2 95 %.  Wt Readings from Last 3 Encounters:  04/01/22 190 lb 9.6 oz (86.5 kg)  02/17/22 185 lb 6.4 oz (84.1 kg)  01/28/22 183 lb (83 kg)    Body mass index is 30.76 kg/m.  Performance status (ECOG): 0 - Asymptomatic  PHYSICAL EXAM:  Physical Exam Constitutional:      General: She is not in acute distress.    Appearance: Normal appearance. She is normal weight.  HENT:     Head: Normocephalic and atraumatic.  Eyes:     General: No scleral icterus.    Extraocular Movements: Extraocular movements intact.     Conjunctiva/sclera: Conjunctivae normal.     Pupils: Pupils are equal, round, and reactive to light.  Cardiovascular:     Rate and Rhythm: Normal rate and regular rhythm.     Pulses: Normal pulses.     Heart sounds: Normal heart sounds. No murmur heard.    No friction rub. No gallop.  Pulmonary:     Effort: Pulmonary effort is normal. No respiratory distress.  Breath sounds: Normal breath sounds.  Chest:  Breasts:    Right: Absent.     Left: Absent.     Comments: Bilateral mastectomies are negative. She has extensive fibrosis on the right with firmness of the axillary tendon. Abdominal:     General: Bowel sounds are normal. There is no distension.     Palpations: Abdomen is soft. There is no hepatomegaly, splenomegaly or mass.     Tenderness: There is no abdominal tenderness.  Musculoskeletal:        General: Normal range of motion.     Cervical back: Normal range of motion and neck supple.     Right lower leg: No edema.     Left lower leg: No edema.  Lymphadenopathy:     Cervical: No cervical adenopathy.  Skin:    General: Skin is warm and dry.  Neurological:     General: No focal deficit present.     Mental Status: She is alert and oriented to person, place, and time. Mental status is at baseline.  Psychiatric:        Mood and Affect: Mood normal.        Behavior: Behavior normal.        Thought Content: Thought content  normal.        Judgment: Judgment normal.     LABS:      Latest Ref Rng & Units 04/01/2022    2:23 PM 03/13/2021   12:00 AM 09/21/2015   10:50 PM  CBC  WBC 4.0 - 10.5 K/uL 5.3  6.0       Hemoglobin 12.0 - 15.0 g/dL 13.4  13.7     14.6   Hematocrit 36.0 - 46.0 % 42.2  41     43.0   Platelets 150 - 400 K/uL 295  266          This result is from an external source.      Latest Ref Rng & Units 04/01/2022    2:23 PM 03/13/2021   12:00 AM 09/21/2015   10:50 PM  CMP  Glucose 70 - 99 mg/dL 98   120   BUN 8 - 23 mg/dL '12  17     15   '$ Creatinine 0.44 - 1.00 mg/dL 0.83  0.7     1.00   Sodium 135 - 145 mmol/L 140  140     143   Potassium 3.5 - 5.1 mmol/L 4.0  3.9     4.4   Chloride 98 - 111 mmol/L 107  106     103   CO2 22 - 32 mmol/L 28  26       Calcium 8.9 - 10.3 mg/dL 8.8  8.7       Total Protein 6.5 - 8.1 g/dL 6.8     Total Bilirubin 0.3 - 1.2 mg/dL 0.6     Alkaline Phos 38 - 126 U/L 82  135       AST 15 - 41 U/L 24  33       ALT 0 - 44 U/L 19  20          This result is from an external source.     STUDIES:  No results found.      I, Rita Ohara, am acting as scribe for Karina Kaplan, MD  I have reviewed this report as typed by the medical scribe, and it is complete and accurate.

## 2022-04-01 ENCOUNTER — Inpatient Hospital Stay: Payer: PPO | Attending: Oncology

## 2022-04-01 ENCOUNTER — Telehealth: Payer: Self-pay

## 2022-04-01 ENCOUNTER — Encounter: Payer: Self-pay | Admitting: Oncology

## 2022-04-01 ENCOUNTER — Inpatient Hospital Stay: Payer: PPO | Admitting: Oncology

## 2022-04-01 ENCOUNTER — Telehealth: Payer: Self-pay | Admitting: Oncology

## 2022-04-01 ENCOUNTER — Other Ambulatory Visit: Payer: Self-pay | Admitting: Oncology

## 2022-04-01 VITALS — BP 138/85 | HR 70 | Temp 98.0°F | Resp 16 | Ht 66.0 in | Wt 190.6 lb

## 2022-04-01 DIAGNOSIS — Z923 Personal history of irradiation: Secondary | ICD-10-CM | POA: Insufficient documentation

## 2022-04-01 DIAGNOSIS — Z853 Personal history of malignant neoplasm of breast: Secondary | ICD-10-CM | POA: Diagnosis not present

## 2022-04-01 DIAGNOSIS — Z803 Family history of malignant neoplasm of breast: Secondary | ICD-10-CM | POA: Diagnosis not present

## 2022-04-01 DIAGNOSIS — Z7982 Long term (current) use of aspirin: Secondary | ICD-10-CM | POA: Insufficient documentation

## 2022-04-01 DIAGNOSIS — Z171 Estrogen receptor negative status [ER-]: Secondary | ICD-10-CM

## 2022-04-01 DIAGNOSIS — Z9013 Acquired absence of bilateral breasts and nipples: Secondary | ICD-10-CM | POA: Diagnosis not present

## 2022-04-01 DIAGNOSIS — C50911 Malignant neoplasm of unspecified site of right female breast: Secondary | ICD-10-CM

## 2022-04-01 DIAGNOSIS — Z9221 Personal history of antineoplastic chemotherapy: Secondary | ICD-10-CM | POA: Diagnosis not present

## 2022-04-01 LAB — CMP (CANCER CENTER ONLY)
ALT: 19 U/L (ref 0–44)
AST: 24 U/L (ref 15–41)
Albumin: 3.8 g/dL (ref 3.5–5.0)
Alkaline Phosphatase: 82 U/L (ref 38–126)
Anion gap: 5 (ref 5–15)
BUN: 12 mg/dL (ref 8–23)
CO2: 28 mmol/L (ref 22–32)
Calcium: 8.8 mg/dL — ABNORMAL LOW (ref 8.9–10.3)
Chloride: 107 mmol/L (ref 98–111)
Creatinine: 0.83 mg/dL (ref 0.44–1.00)
GFR, Estimated: >60 mL/min (ref >60)
Glucose, Bld: 98 mg/dL (ref 70–99)
Potassium: 4 mmol/L (ref 3.5–5.1)
Sodium: 140 mmol/L (ref 135–145)
Total Bilirubin: 0.6 mg/dL (ref 0.3–1.2)
Total Protein: 6.8 g/dL (ref 6.5–8.1)

## 2022-04-01 LAB — CBC WITH DIFFERENTIAL (CANCER CENTER ONLY)
Abs Immature Granulocytes: 0.01 10*3/uL (ref 0.00–0.07)
Basophils Absolute: 0 10*3/uL (ref 0.0–0.1)
Basophils Relative: 1 %
Eosinophils Absolute: 0.1 10*3/uL (ref 0.0–0.5)
Eosinophils Relative: 3 %
HCT: 42.2 % (ref 36.0–46.0)
Hemoglobin: 13.4 g/dL (ref 12.0–15.0)
Immature Granulocytes: 0 %
Lymphocytes Relative: 38 %
Lymphs Abs: 2 10*3/uL (ref 0.7–4.0)
MCH: 28.5 pg (ref 26.0–34.0)
MCHC: 31.8 g/dL (ref 30.0–36.0)
MCV: 89.8 fL (ref 80.0–100.0)
Monocytes Absolute: 0.6 10*3/uL (ref 0.1–1.0)
Monocytes Relative: 11 %
Neutro Abs: 2.5 10*3/uL (ref 1.7–7.7)
Neutrophils Relative %: 47 %
Platelet Count: 295 10*3/uL (ref 150–400)
RBC: 4.7 MIL/uL (ref 3.87–5.11)
RDW: 13 % (ref 11.5–15.5)
WBC Count: 5.3 10*3/uL (ref 4.0–10.5)
nRBC: 0 % (ref 0.0–0.2)

## 2022-04-01 MED ORDER — METOPROLOL TARTRATE 25 MG PO TABS: 25.0000 mg | ORAL_TABLET | Freq: Two times a day (BID) | ORAL | 3 refills | Status: DC

## 2022-04-01 NOTE — Telephone Encounter (Signed)
Patient has been scheduled for follow-up visit per 04/01/22 los. Pt given an appt calendar with date and time.

## 2022-04-01 NOTE — Telephone Encounter (Signed)
Results reviewed with pt as per Dr. Wendy Poet note.  Pt agreed to start Metoprolol Tartrate '25mg'$  BID. Sent to [facility]. Pt verbalized understanding and had no additional questions. Routed to PCP

## 2022-04-07 ENCOUNTER — Telehealth: Payer: Self-pay

## 2022-04-07 NOTE — Telephone Encounter (Signed)
Patient notified

## 2022-04-07 NOTE — Telephone Encounter (Signed)
-----   Message from Derwood Kaplan, MD sent at 04/02/2022  7:12 PM EST ----- Regarding: call Tell her labs look good. Calcium a little low, rec increase to bid if can tolerate

## 2022-05-04 ENCOUNTER — Telehealth: Payer: Self-pay | Admitting: Cardiology

## 2022-05-04 NOTE — Telephone Encounter (Signed)
Spoke with pt she stated that in the past 1.5 weeks her legs have been swelling and she has noticed a weight gain of 3 lbs. Per Her watch her HR and EKG strip has been normal. She started Metoprolol 25 Bid on 04-01-22. No other new meds reported. No shortness of breath. She was working outside.

## 2022-05-04 NOTE — Telephone Encounter (Signed)
Pt c/o medication issue:  1. Name of Medication:   metoprolol tartrate (LOPRESSOR) 25 MG tablet    2. How are you currently taking this medication (dosage and times per day)? As prescribed.   3. Are you having a reaction (difficulty breathing--STAT)? Yes  4. What is your medication issue? Pt believes this medication is causing swelling in legs and feet.  Not currently taking fluid pill and believes she has gained 3 lbs in 5 days.  Also has numbness in toes she hasn't experienced previously.  Requesting call back to discuss.

## 2022-05-07 MED ORDER — FUROSEMIDE 20 MG PO TABS: 20.0000 mg | ORAL_TABLET | Freq: Every day | ORAL | 3 refills | Status: AC

## 2022-05-07 NOTE — Addendum Note (Signed)
Addended by: Jacobo Forest D on: 05/07/2022 11:01 AM   Modules accepted: Orders

## 2022-05-07 NOTE — Telephone Encounter (Signed)
Spoke with pt regarding message. Per Dr. Agustin Cree will send in RX for Lasix '20mg'$  to take one daily PRN leg swelling. Pt agreed and verbalized understanding. She had no further questions.

## 2022-05-26 ENCOUNTER — Encounter: Payer: Self-pay | Admitting: Cardiology

## 2022-05-26 ENCOUNTER — Ambulatory Visit: Payer: PPO | Attending: Cardiology | Admitting: Cardiology

## 2022-05-26 VITALS — BP 118/62 | HR 67 | Ht 66.0 in | Wt 195.4 lb

## 2022-05-26 DIAGNOSIS — R0609 Other forms of dyspnea: Secondary | ICD-10-CM | POA: Diagnosis not present

## 2022-05-26 DIAGNOSIS — R002 Palpitations: Secondary | ICD-10-CM

## 2022-05-26 DIAGNOSIS — E785 Hyperlipidemia, unspecified: Secondary | ICD-10-CM

## 2022-05-26 NOTE — Progress Notes (Signed)
Cardiology Office Note:    Date:  05/26/2022   ID:  Rudolpho Sevin Dorathea Faerber, DOB [birth year], MRN [id number]  PCP:  Marga Hoots, NP  Cardiologist:  Jenne Campus, MD    Referring MD: Carvel Getting Key, *   Chief Complaint  Patient presents with   Follow-up  Feeling much better  History of Present Illness:    Karina Shelton is a 72 y.o. female with past medical history significant for dyslipidemia, breast cancer initially diagnosed in 2000, status post bilateral mastectomy, she was referred to me because of palpitations, dyslipidemia as well as weakness and fatigue.  Evaluation included calcium score which was 0, echocardiogram showed preserved left ventricle ejection fraction, monitor did not show any significant arrhythmia except for some short run of supraventricular tachycardia 100 ventricle tachycardia.  Eventually she was put on beta-blocker comes today to my office for follow-up.  Overall feeling much better.  Palpitations almost completely subsided she started going water aerobic and enjoying it.  He had to stop for 1 week because she did have some GI back.  Past Medical History:  Diagnosis Date   Anemia    when pregnant   Breast cancer ([facility]) 2000   right    Chronic pain    Depression    Fainting    x3   Insomnia    Kidney stone    Personal history of colonic adenomas 10/19/2012    Past Surgical History:  Procedure Laterality Date   CHOLECYSTECTOMY  1983   COLONOSCOPY     MASTECTOMY  2000&2001   bil.   PARTIAL HYSTERECTOMY     TOTAL ABDOMINAL HYSTERECTOMY  2011    Current Medications: Current Meds  Medication Sig   aspirin EC 81 MG tablet Take 325 mg by mouth daily. Swallow whole.   buPROPion (WELLBUTRIN XL) 300 MG 24 hr tablet Take 300 mg by mouth daily.   doxylamine, Sleep, (UNISOM) 25 MG tablet Take 25 mg by mouth at bedtime as needed for sleep.   furosemide (LASIX) 20 MG tablet Take 1 tablet (20 mg total) by mouth daily. As  needed for leg swelling   metoprolol tartrate (LOPRESSOR) 25 MG tablet Take 1 tablet (25 mg total) by mouth 2 (two) times daily.   Multiple Vitamin (MULTIVITAMIN ADULT PO) Take 1 tablet by mouth daily.   traMADol (ULTRAM) 50 MG tablet Take 50 mg by mouth every 6 (six) hours as needed for moderate pain or severe pain.   traZODone (DESYREL) 50 MG tablet Take 50 mg by mouth at bedtime. 30 minutes before bed   [DISCONTINUED] acetaminophen (TYLENOL) 500 MG tablet Take 500 mg by mouth every 6 (six) hours as needed for mild pain or moderate pain.   [DISCONTINUED] Ascorbic Acid (VITAMIN C PO) Take 1 tablet by mouth daily.   [DISCONTINUED] Calcium Carb-Cholecalciferol (CALCIUM PLUS VITAMIN D3) 600-20 MG-MCG TABS Take 1 tablet by mouth daily.   [DISCONTINUED] Cyanocobalamin (B12 LIQUID HEALTH BOOSTER PO) Take 15 mLs by mouth daily.   [DISCONTINUED] Cyanocobalamin (VITAMIN B-12 PO) Take 1,000 mg by mouth daily.     Allergies:   Patient has no known allergies.   Social History   Socioeconomic History   Marital status: Married    Spouse name: Not on file   Number of children: Not on file   Years of education: Not on file   Highest education level: Not on file  Occupational History   Not on file  Tobacco Use   Smoking status:  Former    Types: Cigarettes    Quit date: 1973    Years since quitting: 51.0   Smokeless tobacco: Never  Vaping Use   Vaping Use: Never used  Substance and Sexual Activity   Alcohol use: Yes    Comment: occasional only during events   Drug use: No   Sexual activity: Not on file  Other Topics Concern   Not on file  Social History Narrative   Not on file   Social Determinants of Health   Financial Resource Strain: Not on file  Food Insecurity: Not on file  Transportation Needs: Not on file  Physical Activity: Not on file  Stress: Not on file  Social Connections: Not on file     Family History: The patient's family history includes Breast cancer in her  mother; Cancer in her father. There is no history of Colon cancer, Esophageal cancer, Rectal cancer, or Stomach cancer. ROS:   Please see the history of present illness.    All 14 point review of systems negative except as described per history of present illness  EKGs/Labs/Other Studies Reviewed:      Recent Labs: 04/01/2022: ALT 19; BUN 12; Creatinine 0.83; Hemoglobin 13.4; Platelet Count 295; Potassium 4.0; Sodium 140  Recent Lipid Panel No results found for: "CHOL", "TRIG", "HDL", "CHOLHDL", "VLDL", "LDLCALC", "LDLDIRECT"  Physical Exam:    VS:  BP 118/62 (BP Location: Left Arm, Patient Position: Sitting)   Pulse 67   Ht '5\' 6"'$  (1.676 m)   Wt 195 lb 6.4 oz (88.6 kg)   SpO2 97%   BMI 31.54 kg/m     Wt Readings from Last 3 Encounters:  05/26/22 195 lb 6.4 oz (88.6 kg)  04/01/22 190 lb 9.6 oz (86.5 kg)  02/17/22 185 lb 6.4 oz (84.1 kg)     GEN:  Well nourished, well developed in no acute distress HEENT: Normal NECK: No JVD; No carotid bruits LYMPHATICS: No lymphadenopathy CARDIAC: RRR, no murmurs, no rubs, no gallops RESPIRATORY:  Clear to auscultation without rales, wheezing or rhonchi  ABDOMEN: Soft, non-tender, non-distended MUSCULOSKELETAL:  No edema; No deformity  SKIN: Warm and dry LOWER EXTREMITIES: no swelling NEUROLOGIC:  Alert and oriented x 3 PSYCHIATRIC:  Normal affect   ASSESSMENT:    1. Palpitations   2. Dyspnea on exertion   3. Dyslipidemia    PLAN:    In order of problems listed above:  Palpitations are gone right now continue small dose of beta-blocker. Dyslipidemia her calcium score is 0.  She is reluctant to start any medication therefore we will continue monitoring, diet and exercise.  Her LDL 114 HDL 60 this is from summer 2023 will recheck in the summer again Dyspnea on exertion I encouraged her to BL be more active that should help   Medication Adjustments/Labs and Tests Ordered: Current medicines are reviewed at length with the  patient today.  Concerns regarding medicines are outlined above.  No orders of the defined types were placed in this encounter.  Medication changes: No orders of the defined types were placed in this encounter.   Signed, Park Liter, MD, [facility] 05/26/2022 1:14 PM    [place]

## 2022-05-26 NOTE — Patient Instructions (Signed)

## 2022-08-11 DIAGNOSIS — H2513 Age-related nuclear cataract, bilateral: Secondary | ICD-10-CM | POA: Diagnosis not present

## 2022-09-22 DIAGNOSIS — L821 Other seborrheic keratosis: Secondary | ICD-10-CM | POA: Diagnosis not present

## 2022-09-22 DIAGNOSIS — L820 Inflamed seborrheic keratosis: Secondary | ICD-10-CM | POA: Diagnosis not present

## 2022-09-22 DIAGNOSIS — L905 Scar conditions and fibrosis of skin: Secondary | ICD-10-CM | POA: Diagnosis not present

## 2022-12-21 DIAGNOSIS — F339 Major depressive disorder, recurrent, unspecified: Secondary | ICD-10-CM | POA: Diagnosis not present

## 2022-12-21 DIAGNOSIS — Z6831 Body mass index (BMI) 31.0-31.9, adult: Secondary | ICD-10-CM | POA: Diagnosis not present

## 2022-12-21 DIAGNOSIS — G4700 Insomnia, unspecified: Secondary | ICD-10-CM | POA: Diagnosis not present

## 2022-12-21 DIAGNOSIS — F4321 Adjustment disorder with depressed mood: Secondary | ICD-10-CM | POA: Diagnosis not present

## 2023-01-11 DIAGNOSIS — G4700 Insomnia, unspecified: Secondary | ICD-10-CM | POA: Diagnosis not present

## 2023-04-01 ENCOUNTER — Other Ambulatory Visit: Payer: Self-pay | Admitting: Cardiology

## 2023-04-01 DIAGNOSIS — Z23 Encounter for immunization: Secondary | ICD-10-CM | POA: Diagnosis not present

## 2023-04-01 DIAGNOSIS — G4700 Insomnia, unspecified: Secondary | ICD-10-CM | POA: Diagnosis not present

## 2023-04-01 DIAGNOSIS — Z6830 Body mass index (BMI) 30.0-30.9, adult: Secondary | ICD-10-CM | POA: Diagnosis not present

## 2023-04-01 DIAGNOSIS — R0600 Dyspnea, unspecified: Secondary | ICD-10-CM | POA: Diagnosis not present

## 2023-04-02 ENCOUNTER — Inpatient Hospital Stay: Payer: HMO | Attending: Oncology | Admitting: Hematology and Oncology

## 2023-04-02 ENCOUNTER — Encounter: Payer: Self-pay | Admitting: Hematology and Oncology

## 2023-04-02 ENCOUNTER — Inpatient Hospital Stay: Payer: HMO

## 2023-04-02 VITALS — BP 124/60 | HR 70 | Temp 98.2°F | Resp 20 | Ht 66.0 in | Wt 188.2 lb

## 2023-04-02 DIAGNOSIS — Z9221 Personal history of antineoplastic chemotherapy: Secondary | ICD-10-CM | POA: Diagnosis not present

## 2023-04-02 DIAGNOSIS — Z923 Personal history of irradiation: Secondary | ICD-10-CM | POA: Insufficient documentation

## 2023-04-02 DIAGNOSIS — R1310 Dysphagia, unspecified: Secondary | ICD-10-CM | POA: Insufficient documentation

## 2023-04-02 DIAGNOSIS — Z853 Personal history of malignant neoplasm of breast: Secondary | ICD-10-CM | POA: Insufficient documentation

## 2023-04-02 DIAGNOSIS — R42 Dizziness and giddiness: Secondary | ICD-10-CM | POA: Diagnosis not present

## 2023-04-02 DIAGNOSIS — R519 Headache, unspecified: Secondary | ICD-10-CM | POA: Insufficient documentation

## 2023-04-02 DIAGNOSIS — C50911 Malignant neoplasm of unspecified site of right female breast: Secondary | ICD-10-CM

## 2023-04-02 DIAGNOSIS — Z9013 Acquired absence of bilateral breasts and nipples: Secondary | ICD-10-CM | POA: Diagnosis not present

## 2023-04-02 LAB — CBC WITH DIFFERENTIAL (CANCER CENTER ONLY)
Abs Immature Granulocytes: 0 10*3/uL (ref 0.00–0.07)
Basophils Absolute: 0 10*3/uL (ref 0.0–0.1)
Basophils Relative: 1 %
Eosinophils Absolute: 0.2 10*3/uL (ref 0.0–0.5)
Eosinophils Relative: 3 %
HCT: 41.3 % (ref 36.0–46.0)
Hemoglobin: 14 g/dL (ref 12.0–15.0)
Immature Granulocytes: 0 %
Lymphocytes Relative: 30 %
Lymphs Abs: 1.8 10*3/uL (ref 0.7–4.0)
MCH: 29.4 pg (ref 26.0–34.0)
MCHC: 33.9 g/dL (ref 30.0–36.0)
MCV: 86.8 fL (ref 80.0–100.0)
Monocytes Absolute: 0.5 10*3/uL (ref 0.1–1.0)
Monocytes Relative: 8 %
Neutro Abs: 3.5 10*3/uL (ref 1.7–7.7)
Neutrophils Relative %: 58 %
Platelet Count: 284 10*3/uL (ref 150–400)
RBC: 4.76 MIL/uL (ref 3.87–5.11)
RDW: 12.8 % (ref 11.5–15.5)
WBC Count: 5.9 10*3/uL (ref 4.0–10.5)
nRBC: 0 % (ref 0.0–0.2)
nRBC: 0 /100{WBCs}

## 2023-04-02 LAB — CMP (CANCER CENTER ONLY)
ALT: 20 U/L (ref 0–44)
AST: 26 U/L (ref 15–41)
Albumin: 4.1 g/dL (ref 3.5–5.0)
Alkaline Phosphatase: 105 U/L (ref 38–126)
Anion gap: 11 (ref 5–15)
BUN: 17 mg/dL (ref 8–23)
CO2: 25 mmol/L (ref 22–32)
Calcium: 9.7 mg/dL (ref 8.9–10.3)
Chloride: 105 mmol/L (ref 98–111)
Creatinine: 1 mg/dL (ref 0.44–1.00)
GFR, Estimated: >60 mL/min (ref >60)
Glucose, Bld: 101 mg/dL — ABNORMAL HIGH (ref 70–99)
Potassium: 4 mmol/L (ref 3.5–5.1)
Sodium: 141 mmol/L (ref 135–145)
Total Bilirubin: 0.4 mg/dL (ref <1.2)
Total Protein: 6.9 g/dL (ref 6.5–8.1)

## 2023-04-02 NOTE — Progress Notes (Signed)
[facility] [address] [place],  [place]  [address] [phone]       CLINIC:  Survivorship   REASON FOR VISIT:  Routine follow-up for history of stage IIIA triple negative breast cancer diagnosed in February 2000 for long term survivorship.   BRIEF ONCOLOGIC HISTORY:   Oncology History  Breast cancer (HCC)  06/19/1998 Cancer Staging   Staging form: Breast, AJCC 6th Edition - Clinical stage from 06/19/1998: Stage IIIA (T3, N1, M0) - Signed by Dellia Beckwith, MD on 03/21/2021 Staged by: Managing physician Diagnostic confirmation: Positive histology Specimen type: Excision Histopathologic type: Infiltrating duct carcinoma, NOS Laterality: Right Histologic grade (G): G3 Residual tumor (R): R0 - None Stage prefix: Initial diagnosis Lymphatic vessel invasion (L): L0 - No lymphatic vessel invasion Venous invasion (V): VX - Cannot be assessed Prognostic indicators: Triple negative.  Had right mast and later left mast.  She had adjuvant chemo with AC x 4 followed by 3 months of Taxol   07/02/1998 Initial Diagnosis   Breast cancer [facility])     INTERVAL HISTORY:  Karina Shelton presents to the Survivorship Clinic today for routine follow-up for her history of breast cancer.  Overall, she reports feeling fairly well. She has multiple new complaints, including difficulty occasional difficulty swallowing with occasional regurgitation, intermittent lightheadedness, especially in the morning, occasional difficulty word finding. She saw Dr. Yetta Flock yesterday but did not discuss with her. She reports blurry vision at end of day, she states she has early cataracts per her optometrist being monitored.  She does not sleep well and reports a new diagnosis of sleep apnea. She states she is having additional testing as she does not wish to wear a CPAP.  She developed palpitations and was found to have tachycardia last year resolved with metoprolol.   REVIEW OF SYSTEMS:   Review of Systems  Constitutional:  Positive for fatigue. Negative for appetite change, chills, diaphoresis, fever and unexpected weight change.  HENT:   Positive for trouble swallowing (intermittent, occasional regurgitation). Negative for lump/mass, mouth sores, sore throat and voice change.   Eyes:  Positive for eye problems.  Respiratory:  Negative for cough, hemoptysis and shortness of breath.   Cardiovascular:  Negative for chest pain, leg swelling and palpitations.  Gastrointestinal:  Negative for abdominal pain, constipation, diarrhea, nausea and vomiting.  Endocrine: Negative for hot flashes.  Genitourinary:  Positive for bladder incontinence (occasional). Negative for difficulty urinating, dysuria, frequency, hematuria and vaginal bleeding.   Musculoskeletal:  Negative for arthralgias, back pain, gait problem and myalgias.  Skin:  Negative for rash.  Neurological:  Positive for headaches, light-headedness (in am) and speech difficulty (occasional loss for words). Negative for dizziness, extremity weakness, gait problem and numbness.  Hematological:  Negative for adenopathy. Does not bruise/bleed easily.  Psychiatric/Behavioral:  Positive for sleep disturbance. Negative for depression. The patient is not nervous/anxious.      PAST MEDICAL/SURGICAL HISTORY:  Past Medical History:  Diagnosis Date   Anemia    when pregnant   Breast cancer (HCC) 2000   right    Chronic pain    Depression    Fainting    x3   Insomnia    Kidney stone    Personal history of colonic adenomas 10/19/2012    Past Surgical History:  Procedure Laterality Date   CHOLECYSTECTOMY  1983   COLONOSCOPY     MASTECTOMY  2000&2001   bil.   PARTIAL HYSTERECTOMY     TOTAL ABDOMINAL HYSTERECTOMY  2011     ALLERGIES:  Allergies  Allergen Reactions   Codeine Itching     CURRENT MEDICATIONS:  Outpatient Encounter Medications as of 04/02/2023  Medication Sig   aspirin EC 81 MG tablet Take 325 mg  by mouth daily. Swallow whole.   buPROPion (WELLBUTRIN XL) 300 MG 24 hr tablet Take 300 mg by mouth daily.   doxylamine, Sleep, (UNISOM) 25 MG tablet Take 25 mg by mouth at bedtime as needed for sleep.   furosemide (LASIX) 20 MG tablet Take 1 tablet (20 mg total) by mouth daily. As needed for leg swelling (Patient not taking: Reported on 04/02/2023)   metoprolol tartrate (LOPRESSOR) 25 MG tablet Take 1 tablet (25 mg total) by mouth 2 (two) times daily.   Multiple Vitamin (MULTIVITAMIN ADULT PO) Take 1 tablet by mouth daily.   traMADol (ULTRAM) 50 MG tablet Take 50 mg by mouth every 6 (six) hours as needed for moderate pain or severe pain. (Patient not taking: Reported on 04/02/2023)   traZODone (DESYREL) 50 MG tablet Take 50 mg by mouth at bedtime. 30 minutes before bed   No facility-administered encounter medications on file as of 04/02/2023.     ONCOLOGIC FAMILY HISTORY:  Family History  Problem Relation Age of Onset   Breast cancer Mother    Cancer Father    Colon cancer Neg Hx    Esophageal cancer Neg Hx    Rectal cancer Neg Hx    Stomach cancer Neg Hx      SOCIAL HISTORY:   reports that she quit smoking about 51 years ago. Her smoking use included cigarettes. She has never used smokeless tobacco. She reports current alcohol use. She reports that she does not use drugs.   PHYSICAL EXAMINATION:  Vital Signs: Vitals:   04/02/23 1328  BP: 124/60  Pulse: 70  Resp: 20  Temp: 98.2 F (36.8 C)  SpO2: 97%   Filed Weights   04/02/23 1328  Weight: 188 lb 3.2 oz (85.4 kg)   Physical Exam Vitals and nursing note reviewed.  Constitutional:      General: She is not in acute distress.    Appearance: Normal appearance.  HENT:     Head: Normocephalic and atraumatic.     Mouth/Throat:     Mouth: Mucous membranes are moist.     Pharynx: Oropharynx is clear. No oropharyngeal exudate or posterior oropharyngeal erythema.  Eyes:     General: No scleral icterus.    Extraocular  Movements: Extraocular movements intact.     Conjunctiva/sclera: Conjunctivae normal.     Pupils: Pupils are equal, round, and reactive to light.  Cardiovascular:     Rate and Rhythm: Normal rate and regular rhythm.     Heart sounds: Normal heart sounds. No murmur heard.    No friction rub. No gallop.  Pulmonary:     Effort: Pulmonary effort is normal.     Breath sounds: Normal breath sounds. No wheezing, rhonchi or rales.  Chest:  Breasts:    Right: Absent.     Comments: Bilateral mastectomy sites are negative with stable fibrosis of the right mastectomy site Abdominal:     General: There is no distension.     Palpations: Abdomen is soft. There is no hepatomegaly, splenomegaly or mass.     Tenderness: There is no abdominal tenderness.  Musculoskeletal:        General: Normal range of motion.     Cervical back: Normal range of motion and neck supple. No tenderness.  Right lower leg: No edema.     Left lower leg: No edema.  Lymphadenopathy:     Cervical: No cervical adenopathy.     Upper Body:     Right upper body: No supraclavicular or axillary adenopathy.     Left upper body: No supraclavicular or axillary adenopathy.     Lower Body: No right inguinal adenopathy. No left inguinal adenopathy.  Skin:    General: Skin is warm and dry.     Coloration: Skin is not jaundiced.     Findings: No rash.  Neurological:     Mental Status: She is alert and oriented to person, place, and time.     Cranial Nerves: No cranial nerve deficit.  Psychiatric:        Mood and Affect: Mood normal.        Behavior: Behavior normal.        Thought Content: Thought content normal.     LABORATORY DATA:     Latest Ref Rng & Units 04/02/2023   12:50 PM 04/01/2022    2:23 PM 03/13/2021   12:00 AM  CBC  WBC 4.0 - 10.5 K/uL 5.9  5.3  6.0      Hemoglobin 12.0 - 15.0 g/dL 09.8  11.9  14.7      Hematocrit 36.0 - 46.0 % 41.3  42.2  41      Platelets 150 - 400 K/uL 284  295  266         This  result is from an external source.      Latest Ref Rng & Units 04/02/2023   12:50 PM 04/01/2022    2:23 PM 03/13/2021   12:00 AM  CMP  Glucose 70 - 99 mg/dL 829  98    BUN 8 - 23 mg/dL 17  12  17       Creatinine 0.44 - 1.00 mg/dL 5.62  1.30  0.7      Sodium 135 - 145 mmol/L 141  140  140      Potassium 3.5 - 5.1 mmol/L 4.0  4.0  3.9      Chloride 98 - 111 mmol/L 105  107  106      CO2 22 - 32 mmol/L 25  28  26       Calcium 8.9 - 10.3 mg/dL 9.7  8.8  8.7      Total Protein 6.5 - 8.1 g/dL 6.9  6.8    Total Bilirubin <1.2 mg/dL 0.4  0.6    Alkaline Phos 38 - 126 U/L 105  82  135      AST 15 - 41 U/L 26  24  33      ALT 0 - 44 U/L 20  19  20          This result is from an external source.    DIAGNOSTIC IMAGING:  No results found.   ASSESSMENT AND PLAN:  Ms.. Caamano is a pleasant 72 y.o. female with history of stage III triple negative breast cancer diagnosed in 2000; treated with right mastectomy, adjuvant chemotherapy with doxorubicin for 4 cycles following by paclitaxel for 4 cycles. She received radiation therapy to the right chest wall. She later underwent left mastectomy due to her request as she had a great deal of anxiety over the possibility of a contralateral breast cancer.  1. History of stage IIIA breast cancer:  Ms. Mandez is currently clinically without evidence of disease or recurrence of breast cancer.  She will follow-up  in the Survivorship Clinic in 1 year with labs, history, and physical exam per surveillance protocol.  I encouraged her to call me with any questions or concerns before her next visit at the cancer center, and I would be happy to see her sooner, if needed.    2. Problem(s) at Visit:  -Difficulty swallowing with occasional regurgitation. Requested she see her GI Dr. Leone Payor, but she preferred to stay local. I will refer her to Dr. Jennye Boroughs.  -Morning lightheadedness, she has occasional mild headaches which have not increased in frequency or  severity. This may be due to orthostatic hypotension. I encouraged her to drink plenty of clear fluids and see Dr. Yetta Flock again if this persists.  -Occasional urinary incontinence without other urinary symptoms. She has not had recent incontinence. Advised her to see Dr. Yetta Flock if this recurs.  -  #. Cancer screening:  Due to Ms. Mallory history and age, she should receive screening for skin cancers and colon cancer. The patient was encouraged to follow-up with her PCP for appropriate cancer screenings.   #. Health maintenance and wellness promotion: Ms. Ziomek was encouraged to consume 5-7 servings of fruits and vegetables per day. The patient was also encouraged to engage in moderate to vigorous exercise for 30 minutes per day most days of the week. She does not exercise regularly. Ms. Trost was instructed to limit her alcohol consumption.  She rarely drinks alcohol.    Disposition:  -Return to Cancer Center to see Survivorship PA in 1 year.   A total of 40 of face-to-face time was spent with this patient with greater than 50% of that time in counseling and care-coordination.   Yasmin Dibello A. Sol Passer, PA-C Physician Assistant [facility]   Note: PRIMARY CARE PROVIDER Abner Greenspan, MD [phone]

## 2023-04-04 ENCOUNTER — Encounter: Payer: Self-pay | Admitting: Hematology and Oncology

## 2023-04-04 DIAGNOSIS — Z853 Personal history of malignant neoplasm of breast: Secondary | ICD-10-CM | POA: Insufficient documentation

## 2023-04-06 ENCOUNTER — Telehealth: Payer: Self-pay

## 2023-04-06 NOTE — Telephone Encounter (Signed)
-----   Message from Karina Shelton sent at 04/04/2023  9:59 AM EST ----- She reported difficult swallowing with occasional regurgitation. I asked her to f/u with GI she sees in [place]. She might be the one who requested referral to [facility]. Would you confirm and send referral if needed? Thanks

## 2023-04-06 NOTE — Telephone Encounter (Signed)
Referral sent via email fax on 04/05/23.

## 2023-04-07 ENCOUNTER — Telehealth: Payer: Self-pay

## 2023-04-07 NOTE — Addendum Note (Signed)
Addended by: Lianne Bushy on: 04/07/2023 09:18 AM   Modules accepted: Orders

## 2023-04-07 NOTE — Telephone Encounter (Signed)
Referral faxed

## 2023-04-07 NOTE — Telephone Encounter (Signed)
-----   Message from Adah Perl sent at 04/02/2023  2:06 PM EST ----- Please refer to Dr. Jennye Boroughs for difficulty swallowing, occasional regurgitation. Thanks

## 2023-04-23 ENCOUNTER — Other Ambulatory Visit: Payer: Self-pay | Admitting: Cardiology

## 2023-04-27 DIAGNOSIS — R0600 Dyspnea, unspecified: Secondary | ICD-10-CM | POA: Diagnosis not present

## 2023-04-28 DIAGNOSIS — Z6830 Body mass index (BMI) 30.0-30.9, adult: Secondary | ICD-10-CM | POA: Diagnosis not present

## 2023-04-28 DIAGNOSIS — L608 Other nail disorders: Secondary | ICD-10-CM | POA: Diagnosis not present

## 2023-05-06 DIAGNOSIS — J9610 Chronic respiratory failure, unspecified whether with hypoxia or hypercapnia: Secondary | ICD-10-CM | POA: Diagnosis not present

## 2023-05-06 DIAGNOSIS — Z139 Encounter for screening, unspecified: Secondary | ICD-10-CM | POA: Diagnosis not present

## 2023-05-06 DIAGNOSIS — E669 Obesity, unspecified: Secondary | ICD-10-CM | POA: Diagnosis not present

## 2023-05-06 DIAGNOSIS — Z1331 Encounter for screening for depression: Secondary | ICD-10-CM | POA: Diagnosis not present

## 2023-05-06 DIAGNOSIS — R0600 Dyspnea, unspecified: Secondary | ICD-10-CM | POA: Diagnosis not present

## 2023-05-06 DIAGNOSIS — Z6830 Body mass index (BMI) 30.0-30.9, adult: Secondary | ICD-10-CM | POA: Diagnosis not present

## 2023-05-06 DIAGNOSIS — Z23 Encounter for immunization: Secondary | ICD-10-CM | POA: Diagnosis not present

## 2023-05-06 DIAGNOSIS — Z0000 Encounter for general adult medical examination without abnormal findings: Secondary | ICD-10-CM | POA: Diagnosis not present

## 2023-05-06 DIAGNOSIS — J984 Other disorders of lung: Secondary | ICD-10-CM | POA: Diagnosis not present

## 2023-05-06 DIAGNOSIS — Z136 Encounter for screening for cardiovascular disorders: Secondary | ICD-10-CM | POA: Diagnosis not present

## 2023-05-06 DIAGNOSIS — Z1339 Encounter for screening examination for other mental health and behavioral disorders: Secondary | ICD-10-CM | POA: Diagnosis not present

## 2023-05-10 DIAGNOSIS — R0600 Dyspnea, unspecified: Secondary | ICD-10-CM | POA: Diagnosis not present

## 2023-05-10 DIAGNOSIS — J9610 Chronic respiratory failure, unspecified whether with hypoxia or hypercapnia: Secondary | ICD-10-CM | POA: Diagnosis not present

## 2023-05-18 ENCOUNTER — Encounter: Payer: Self-pay | Admitting: Cardiology

## 2023-05-18 ENCOUNTER — Ambulatory Visit: Payer: HMO | Attending: Cardiology | Admitting: Cardiology

## 2023-05-18 VITALS — BP 122/82 | HR 75 | Ht 66.0 in | Wt 190.4 lb

## 2023-05-18 DIAGNOSIS — Z8249 Family history of ischemic heart disease and other diseases of the circulatory system: Secondary | ICD-10-CM

## 2023-05-18 DIAGNOSIS — R0609 Other forms of dyspnea: Secondary | ICD-10-CM | POA: Diagnosis not present

## 2023-05-18 DIAGNOSIS — E785 Hyperlipidemia, unspecified: Secondary | ICD-10-CM | POA: Diagnosis not present

## 2023-05-18 DIAGNOSIS — R002 Palpitations: Secondary | ICD-10-CM

## 2023-05-18 NOTE — Patient Instructions (Signed)

## 2023-05-18 NOTE — Progress Notes (Signed)
 Cardiology Office Note:    Date:  05/18/2023   ID:  Paisley Grajeda, DOB 04-21-1951, MRN [id number]  PCP:  Trinidad Hun, MD  Cardiologist:  Lamar Fitch, MD    Referring MD: Trinidad Hun, MD   No chief complaint on file.   History of Present Illness:    Karina Shelton is a 72 y.o. female past medical history significant for dyslipidemia, breast cancer initially diagnosed in 2000 status post bilateral mastectomy she was referred to us  because of palpitations dyslipidemia as well as weakness.  Evaluation included coronary calcium score which was 0, echocardiogram showed preserved ejection fraction, monitor did not show any significant arrhythmia except for runs of supraventricular tachycardia.  She was put on beta-blocker and did quite well however now the issue is some shortness of breath that is being aggressively investigated by pulmonary her primary care physician.  She is scheduled to have some testing tomorrow which my understanding close pulmonary function test  Past Medical History:  Diagnosis Date   Anemia    when pregnant   Breast cancer (HCC) 2000   right    Chronic pain    Depression    Fainting    x3   Insomnia    Kidney stone    Personal history of colonic adenomas 10/19/2012    Past Surgical History:  Procedure Laterality Date   CHOLECYSTECTOMY  1983   COLONOSCOPY     MASTECTOMY  2000&2001   bil.   PARTIAL HYSTERECTOMY     TOTAL ABDOMINAL HYSTERECTOMY  2011    Current Medications: Current Meds  Medication Sig   aspirin EC 81 MG tablet Take 325 mg by mouth daily. Swallow whole.   buPROPion (WELLBUTRIN XL) 300 MG 24 hr tablet Take 300 mg by mouth daily.   doxylamine, Sleep, (UNISOM) 25 MG tablet Take 25 mg by mouth at bedtime as needed for sleep.   metoprolol  tartrate (LOPRESSOR ) 25 MG tablet Take 1 tablet (25 mg total) by mouth 2 (two) times daily.   Multiple Vitamin (MULTIVITAMIN ADULT PO) Take 1 tablet by mouth daily.   OXYGEN   Inhale 1 L into the lungs at bedtime.   traZODone (DESYREL) 50 MG tablet Take 50 mg by mouth at bedtime. 30 minutes before bed     Allergies:   Codeine   Social History   Socioeconomic History   Marital status: Married    Spouse name: Not on file   Number of children: Not on file   Years of education: Not on file   Highest education level: Not on file  Occupational History   Not on file  Tobacco Use   Smoking status: Former    Current packs/day: 0.00    Types: Cigarettes    Quit date: 63    Years since quitting: 52.0   Smokeless tobacco: Never  Vaping Use   Vaping status: Never Used  Substance and Sexual Activity   Alcohol  use: Yes    Comment: occasional only during events   Drug use: No   Sexual activity: Not on file  Other Topics Concern   Not on file  Social History Narrative   Not on file   Social Drivers of Health   Financial Resource Strain: Not on file  Food Insecurity: Not on file  Transportation Needs: Not on file  Physical Activity: Not on file  Stress: Not on file  Social Connections: Not on file     Family History: The patient's family history includes Breast  cancer in her mother; Cancer in her father. There is no history of Colon cancer, Esophageal cancer, Rectal cancer, or Stomach cancer. ROS:   Please see the history of present illness.    All 14 point review of systems negative except as described per history of present illness  EKGs/Labs/Other Studies Reviewed:    EKG Interpretation Date/Time:  Tuesday May 18 2023 16:10:44 EST Ventricular Rate:  71 PR Interval:  166 QRS Duration:  82 QT Interval:  412 QTC Calculation: 447 R Axis:   -3  Text Interpretation: Normal sinus rhythm Moderate voltage criteria for LVH, may be normal variant Borderline ECG When compared with ECG of 21-Sep-2015 23:02, PREVIOUS ECG IS PRESENT Confirmed by Bernie Charleston [phone]) on 05/18/2023 4:32:31 PM    Recent Labs: 04/02/2023: ALT 20; BUN 17;  Creatinine 1.00; Hemoglobin 14.0; Platelet Count 284; Potassium 4.0; Sodium 141  Recent Lipid Panel No results found for: CHOL, TRIG, HDL, CHOLHDL, VLDL, LDLCALC, LDLDIRECT  Physical Exam:    VS:  BP 122/82   Pulse 75   Ht 5' 6 (1.676 m)   Wt 190 lb 6.4 oz (86.4 kg)   SpO2 94%   BMI 30.73 kg/m     Wt Readings from Last 3 Encounters:  05/18/23 190 lb 6.4 oz (86.4 kg)  04/02/23 188 lb 3.2 oz (85.4 kg)  05/26/22 195 lb 6.4 oz (88.6 kg)     GEN:  Well nourished, well developed in no acute distress HEENT: Normal NECK: No JVD; No carotid bruits LYMPHATICS: No lymphadenopathy CARDIAC: RRR, no murmurs, no rubs, no gallops RESPIRATORY:  Clear to auscultation without rales, wheezing or rhonchi  ABDOMEN: Soft, non-tender, non-distended MUSCULOSKELETAL:  No edema; No deformity  SKIN: Warm and dry LOWER EXTREMITIES: no swelling NEUROLOGIC:  Alert and oriented x 3 PSYCHIATRIC:  Normal affect   ASSESSMENT:    1. Family history of early CAD   2. Dyspnea on exertion   3. Dyslipidemia   4. Palpitations    PLAN:    In order of problems listed above:  Family history of CAD.  She denies have any signs symptoms that we will indicate coronary artery disease.  Shortness of breath probably related to her lungs that being investigated I told her if this investigation will be negative she need to come back to me and we will continue workup for cardiac cause of this problem. Dyslipidemia.  Her total cholesterol I do not have her fasting lipid profile will call primary care physician to get it.  Last number I have is from summer of last year. Palpitations suppressed with medications which I will continue.   Medication Adjustments/Labs and Tests Ordered: Current medicines are reviewed at length with the patient today.  Concerns regarding medicines are outlined above.  Orders Placed This Encounter  Procedures   EKG 12-Lead   Medication changes: No orders of the defined types  were placed in this encounter.   Signed, [place] DOROTHA Bernie, MD, [facility] 05/18/2023 4:53 PM    [facility]

## 2023-05-21 DIAGNOSIS — J9610 Chronic respiratory failure, unspecified whether with hypoxia or hypercapnia: Secondary | ICD-10-CM | POA: Diagnosis not present

## 2023-05-21 DIAGNOSIS — R0600 Dyspnea, unspecified: Secondary | ICD-10-CM | POA: Diagnosis not present

## 2023-05-24 DIAGNOSIS — R918 Other nonspecific abnormal finding of lung field: Secondary | ICD-10-CM | POA: Diagnosis not present

## 2023-05-29 ENCOUNTER — Other Ambulatory Visit: Payer: Self-pay | Admitting: Cardiology

## 2023-06-10 DIAGNOSIS — R0600 Dyspnea, unspecified: Secondary | ICD-10-CM | POA: Diagnosis not present

## 2023-06-10 DIAGNOSIS — J9610 Chronic respiratory failure, unspecified whether with hypoxia or hypercapnia: Secondary | ICD-10-CM | POA: Diagnosis not present

## 2023-06-18 DIAGNOSIS — E2839 Other primary ovarian failure: Secondary | ICD-10-CM | POA: Diagnosis not present

## 2023-06-18 DIAGNOSIS — Z13820 Encounter for screening for osteoporosis: Secondary | ICD-10-CM | POA: Diagnosis not present

## 2023-06-18 DIAGNOSIS — M85852 Other specified disorders of bone density and structure, left thigh: Secondary | ICD-10-CM | POA: Diagnosis not present

## 2023-07-11 DIAGNOSIS — J9610 Chronic respiratory failure, unspecified whether with hypoxia or hypercapnia: Secondary | ICD-10-CM | POA: Diagnosis not present

## 2023-07-11 DIAGNOSIS — R0600 Dyspnea, unspecified: Secondary | ICD-10-CM | POA: Diagnosis not present

## 2023-08-04 DIAGNOSIS — K219 Gastro-esophageal reflux disease without esophagitis: Secondary | ICD-10-CM | POA: Diagnosis not present

## 2023-08-04 DIAGNOSIS — R1310 Dysphagia, unspecified: Secondary | ICD-10-CM | POA: Diagnosis not present

## 2023-08-08 DIAGNOSIS — R0600 Dyspnea, unspecified: Secondary | ICD-10-CM | POA: Diagnosis not present

## 2023-08-08 DIAGNOSIS — J9610 Chronic respiratory failure, unspecified whether with hypoxia or hypercapnia: Secondary | ICD-10-CM | POA: Diagnosis not present

## 2023-08-18 DIAGNOSIS — F32A Depression, unspecified: Secondary | ICD-10-CM | POA: Diagnosis not present

## 2023-08-18 DIAGNOSIS — R059 Cough, unspecified: Secondary | ICD-10-CM | POA: Diagnosis not present

## 2023-08-18 DIAGNOSIS — Z853 Personal history of malignant neoplasm of breast: Secondary | ICD-10-CM | POA: Diagnosis not present

## 2023-08-18 DIAGNOSIS — D649 Anemia, unspecified: Secondary | ICD-10-CM | POA: Diagnosis not present

## 2023-08-18 DIAGNOSIS — Z79899 Other long term (current) drug therapy: Secondary | ICD-10-CM | POA: Diagnosis not present

## 2023-08-18 DIAGNOSIS — R1310 Dysphagia, unspecified: Secondary | ICD-10-CM | POA: Diagnosis not present

## 2023-08-18 DIAGNOSIS — K222 Esophageal obstruction: Secondary | ICD-10-CM | POA: Diagnosis not present

## 2023-08-18 DIAGNOSIS — K219 Gastro-esophageal reflux disease without esophagitis: Secondary | ICD-10-CM | POA: Diagnosis not present

## 2023-08-18 DIAGNOSIS — K449 Diaphragmatic hernia without obstruction or gangrene: Secondary | ICD-10-CM | POA: Diagnosis not present

## 2023-08-18 DIAGNOSIS — Z7982 Long term (current) use of aspirin: Secondary | ICD-10-CM | POA: Diagnosis not present

## 2023-09-08 DIAGNOSIS — R0600 Dyspnea, unspecified: Secondary | ICD-10-CM | POA: Diagnosis not present

## 2023-09-08 DIAGNOSIS — J9610 Chronic respiratory failure, unspecified whether with hypoxia or hypercapnia: Secondary | ICD-10-CM | POA: Diagnosis not present

## 2023-10-08 DIAGNOSIS — J9610 Chronic respiratory failure, unspecified whether with hypoxia or hypercapnia: Secondary | ICD-10-CM | POA: Diagnosis not present

## 2023-10-08 DIAGNOSIS — R0600 Dyspnea, unspecified: Secondary | ICD-10-CM | POA: Diagnosis not present

## 2023-10-15 ENCOUNTER — Other Ambulatory Visit: Payer: Self-pay | Admitting: Cardiology

## 2023-10-21 DIAGNOSIS — D1809 Hemangioma of other sites: Secondary | ICD-10-CM | POA: Diagnosis not present

## 2023-10-21 DIAGNOSIS — L820 Inflamed seborrheic keratosis: Secondary | ICD-10-CM | POA: Diagnosis not present

## 2023-10-21 DIAGNOSIS — D225 Melanocytic nevi of trunk: Secondary | ICD-10-CM | POA: Diagnosis not present

## 2023-10-21 DIAGNOSIS — D1801 Hemangioma of skin and subcutaneous tissue: Secondary | ICD-10-CM | POA: Diagnosis not present

## 2023-10-21 DIAGNOSIS — L821 Other seborrheic keratosis: Secondary | ICD-10-CM | POA: Diagnosis not present

## 2023-10-21 DIAGNOSIS — I788 Other diseases of capillaries: Secondary | ICD-10-CM | POA: Diagnosis not present

## 2023-10-21 DIAGNOSIS — D485 Neoplasm of uncertain behavior of skin: Secondary | ICD-10-CM | POA: Diagnosis not present

## 2023-11-08 DIAGNOSIS — R0600 Dyspnea, unspecified: Secondary | ICD-10-CM | POA: Diagnosis not present

## 2023-11-08 DIAGNOSIS — J9610 Chronic respiratory failure, unspecified whether with hypoxia or hypercapnia: Secondary | ICD-10-CM | POA: Diagnosis not present

## 2023-12-07 DIAGNOSIS — R110 Nausea: Secondary | ICD-10-CM | POA: Diagnosis not present

## 2023-12-07 DIAGNOSIS — R079 Chest pain, unspecified: Secondary | ICD-10-CM | POA: Diagnosis not present

## 2023-12-07 DIAGNOSIS — I10 Essential (primary) hypertension: Secondary | ICD-10-CM | POA: Diagnosis not present

## 2023-12-07 DIAGNOSIS — R519 Headache, unspecified: Secondary | ICD-10-CM | POA: Diagnosis not present

## 2023-12-07 DIAGNOSIS — H8110 Benign paroxysmal vertigo, unspecified ear: Secondary | ICD-10-CM | POA: Diagnosis not present

## 2023-12-07 DIAGNOSIS — Z79899 Other long term (current) drug therapy: Secondary | ICD-10-CM | POA: Diagnosis not present

## 2023-12-07 DIAGNOSIS — R42 Dizziness and giddiness: Secondary | ICD-10-CM | POA: Diagnosis not present

## 2023-12-07 DIAGNOSIS — Z7982 Long term (current) use of aspirin: Secondary | ICD-10-CM | POA: Diagnosis not present

## 2023-12-08 DIAGNOSIS — R0600 Dyspnea, unspecified: Secondary | ICD-10-CM | POA: Diagnosis not present

## 2023-12-08 DIAGNOSIS — J9610 Chronic respiratory failure, unspecified whether with hypoxia or hypercapnia: Secondary | ICD-10-CM | POA: Diagnosis not present

## 2024-01-04 DIAGNOSIS — H524 Presbyopia: Secondary | ICD-10-CM | POA: Diagnosis not present

## 2024-01-04 DIAGNOSIS — H5203 Hypermetropia, bilateral: Secondary | ICD-10-CM | POA: Diagnosis not present

## 2024-01-04 DIAGNOSIS — D3131 Benign neoplasm of right choroid: Secondary | ICD-10-CM | POA: Diagnosis not present

## 2024-01-04 DIAGNOSIS — H25813 Combined forms of age-related cataract, bilateral: Secondary | ICD-10-CM | POA: Diagnosis not present

## 2024-01-04 DIAGNOSIS — H52223 Regular astigmatism, bilateral: Secondary | ICD-10-CM | POA: Diagnosis not present

## 2024-01-08 DIAGNOSIS — R0600 Dyspnea, unspecified: Secondary | ICD-10-CM | POA: Diagnosis not present

## 2024-01-08 DIAGNOSIS — J9610 Chronic respiratory failure, unspecified whether with hypoxia or hypercapnia: Secondary | ICD-10-CM | POA: Diagnosis not present

## 2024-02-08 DIAGNOSIS — J9610 Chronic respiratory failure, unspecified whether with hypoxia or hypercapnia: Secondary | ICD-10-CM | POA: Diagnosis not present

## 2024-02-08 DIAGNOSIS — R0600 Dyspnea, unspecified: Secondary | ICD-10-CM | POA: Diagnosis not present

## 2024-03-09 DIAGNOSIS — R0600 Dyspnea, unspecified: Secondary | ICD-10-CM | POA: Diagnosis not present

## 2024-03-09 DIAGNOSIS — J9610 Chronic respiratory failure, unspecified whether with hypoxia or hypercapnia: Secondary | ICD-10-CM | POA: Diagnosis not present

## 2024-03-15 ENCOUNTER — Other Ambulatory Visit: Payer: Self-pay | Admitting: Hematology and Oncology

## 2024-03-15 DIAGNOSIS — Z853 Personal history of malignant neoplasm of breast: Secondary | ICD-10-CM

## 2024-03-17 DIAGNOSIS — J209 Acute bronchitis, unspecified: Secondary | ICD-10-CM | POA: Diagnosis not present

## 2024-03-17 DIAGNOSIS — R079 Chest pain, unspecified: Secondary | ICD-10-CM | POA: Diagnosis not present

## 2024-03-17 DIAGNOSIS — R531 Weakness: Secondary | ICD-10-CM | POA: Diagnosis not present

## 2024-03-17 DIAGNOSIS — R5383 Other fatigue: Secondary | ICD-10-CM | POA: Diagnosis not present

## 2024-03-17 DIAGNOSIS — R051 Acute cough: Secondary | ICD-10-CM | POA: Diagnosis not present

## 2024-03-31 ENCOUNTER — Inpatient Hospital Stay: Payer: HMO | Admitting: Hematology and Oncology

## 2024-03-31 ENCOUNTER — Inpatient Hospital Stay: Payer: HMO

## 2024-04-03 DIAGNOSIS — R0600 Dyspnea, unspecified: Secondary | ICD-10-CM | POA: Diagnosis not present

## 2024-04-03 DIAGNOSIS — J9610 Chronic respiratory failure, unspecified whether with hypoxia or hypercapnia: Secondary | ICD-10-CM | POA: Diagnosis not present

## 2024-04-03 DIAGNOSIS — J984 Other disorders of lung: Secondary | ICD-10-CM | POA: Diagnosis not present

## 2024-04-03 DIAGNOSIS — Z6833 Body mass index (BMI) 33.0-33.9, adult: Secondary | ICD-10-CM | POA: Diagnosis not present

## 2024-04-07 NOTE — Progress Notes (Deleted)
 [facility] [address] [place],  [place]  [address] [phone]      PATIENT CARE TEAM: Patient Care Team: Trinidad Hun, MD as PCP - General (Family Medicine)  DATE OF VISIT: 04/07/24  CLINIC:  Long Term Survivorship   REASON FOR VISIT:  Routine follow-up for history of breast cancer   BRIEF ONCOLOGIC HISTORY:  Karina Shelton is a 73 y.o. female with a history of stage IIB (T3 N1 M0) triple negative breast cancer diagnosed in February 2000.  She was treated with a right mastectomy and was triple negative.  She received 6 months of adjuvant chemotherapy with 3 months of doxorubicin and cyclophosphamide followed by 3 months of paclitaxel.  She later had a left mastectomy as well.  She is very anxious about recurrence because her mother died of metastatic breast cancer.     She had a tubular adenoma on colonoscopy in June 2014 and 2 sessile polyps.  Repeat in 5 years was recommended.  Colonoscopy in November 2020 revealed two colonic angiodysplastic lesions and external hemorrhoids, otherwise normal.  Repeat ***  in 10 years was recommended  She had an EGD in April 2025 for dysphagia and chronic cough.  She was found to have a hiatal hernia and underwent empiric esophageal dilation.  Esophageal biopsies were negative.  She was seen at the [facility] ED in July with dizziness.  CT head did not reveal any acute intracranial abnormality.  Oncology History  Breast cancer (HCC)  06/19/1998 Cancer Staging   Staging form: Breast, AJCC 6th Edition - Clinical stage from 06/19/1998: Stage IIIA (T3, N1, M0) - Signed by Cornelius Wanda DEL, MD on 03/21/2021 Staged by: Managing physician Diagnostic confirmation: Positive histology Specimen type: Excision Histopathologic type: Infiltrating duct carcinoma, NOS Laterality: Right Histologic grade (G): G3 Residual tumor (R): R0 - None Stage prefix: Initial diagnosis Lymphatic vessel invasion (L): L0 - No  lymphatic vessel invasion Venous invasion (V): VX - Cannot be assessed Prognostic indicators: Triple negative.  Had right mast and later left mast.  She had adjuvant chemo with AC x 4 followed by 3 months of Taxol   07/02/1998 Initial Diagnosis   Breast cancer [facility])     INTERVAL HISTORY:  Karina Shelton presents to the Survivorship Clinic today for routine follow-up for her history of stage IIB triple negative right breast cancer.  Overall, she reports feeling quite well.  She denies any changes in her mastectomy sites.  She denies any changes in her family history.  ***   REVIEW OF SYSTEMS:  Review of Systems - Oncology   PAST MEDICAL/SURGICAL HISTORY:  Past Medical History:  Diagnosis Date   Anemia    when pregnant   Breast cancer (HCC) 2000   right    Chronic pain    Depression    Fainting    x3   Insomnia    Kidney stone    Personal history of colonic adenomas 10/19/2012    Past Surgical History:  Procedure Laterality Date   CHOLECYSTECTOMY  1983   COLONOSCOPY     MASTECTOMY  2000&2001   bil.   PARTIAL HYSTERECTOMY     TOTAL ABDOMINAL HYSTERECTOMY  2011     ALLERGIES:  Allergies  Allergen Reactions   Codeine Itching     CURRENT MEDICATIONS:  Outpatient Encounter Medications as of 04/10/2024  Medication Sig   aspirin EC 81 MG tablet Take 325 mg by mouth daily. Swallow whole.   buPROPion (WELLBUTRIN XL)  300 MG 24 hr tablet Take 300 mg by mouth daily.   doxylamine, Sleep, (UNISOM) 25 MG tablet Take 25 mg by mouth at bedtime as needed for sleep.   furosemide  (LASIX ) 20 MG tablet Take 1 tablet (20 mg total) by mouth daily. As needed for leg swelling (Patient not taking: Reported on 05/18/2023)   metoprolol  tartrate (LOPRESSOR ) 25 MG tablet TAKE 1 TABLET BY MOUTH TWICE A DAY   Multiple Vitamin (MULTIVITAMIN ADULT PO) Take 1 tablet by mouth daily.   OXYGEN  Inhale 1 L into the lungs at bedtime.   traMADol (ULTRAM) 50 MG tablet Take 50 mg by mouth every 6 (six)  hours as needed for moderate pain or severe pain. (Patient not taking: Reported on 05/18/2023)   traZODone (DESYREL) 50 MG tablet Take 50 mg by mouth at bedtime. 30 minutes before bed   No facility-administered encounter medications on file as of 04/10/2024.     ONCOLOGIC FAMILY HISTORY:  Family History  Problem Relation Age of Onset   Breast cancer Mother    Cancer Father    Colon cancer Neg Hx    Esophageal cancer Neg Hx    Rectal cancer Neg Hx    Stomach cancer Neg Hx      SOCIAL HISTORY:   reports that she quit smoking about 52 years ago. Her smoking use included cigarettes. She has never used smokeless tobacco. She reports current alcohol  use. She reports that she does not use drugs.   PHYSICAL EXAMINATION:  Vital Signs: There were no vitals filed for this visit. There were no vitals filed for this visit. Physical Exam   LABORATORY DATA:     Latest Ref Rng & Units 04/02/2023   12:50 PM 04/01/2022    2:23 PM 03/13/2021   12:00 AM  CBC  WBC 4.0 - 10.5 K/uL 5.9  5.3  6.0      Hemoglobin 12.0 - 15.0 g/dL 85.9  86.5  86.2      Hematocrit 36.0 - 46.0 % 41.3  42.2  41      Platelets 150 - 400 K/uL 284  295  266         This result is from an external source.      Latest Ref Rng & Units 04/02/2023   12:50 PM 04/01/2022    2:23 PM 03/13/2021   12:00 AM  CMP  Glucose 70 - 99 mg/dL 898  98    BUN 8 - 23 mg/dL 17  12  17       Creatinine 0.44 - 1.00 mg/dL 8.99  9.16  0.7      Sodium 135 - 145 mmol/L 141  140  140      Potassium 3.5 - 5.1 mmol/L 4.0  4.0  3.9      Chloride 98 - 111 mmol/L 105  107  106      CO2 22 - 32 mmol/L 25  28  26       Calcium 8.9 - 10.3 mg/dL 9.7  8.8  8.7      Total Protein 6.5 - 8.1 g/dL 6.9  6.8    Total Bilirubin <1.2 mg/dL 0.4  0.6    Alkaline Phos 38 - 126 U/L 105  82  135      AST 15 - 41 U/L 26  24  33      ALT 0 - 44 U/L 20  19  20          This result is from  an external source.    DIAGNOSTIC IMAGING:  No results found.    ASSESSMENT AND PLAN:  Karina Shelton is a pleasant 73 y.o. female with history of stage IIB triple negative right breast cancer diagnosed in February 2000 and.  She was treated with right mastectomy followed by adjuvant chemotherapy with doxorubicin/cyclophosphamide, followed by paclitaxel.   1. History of stage IIB triple negative right breast cancer:  Karina Shelton is currently clinically and radiographically without evidence of disease or recurrence of breast cancer.  She will follow-up in the Survivorship Clinic in 1 year with labs, history, and physical exam per surveillance protocol.  I encouraged her to call me with any questions or concerns before her next visit at the cancer center, and I would be happy to see the patient sooner, if needed.    ***. Problem(s) at Visit___________________.  FERNAND Cancer screening:  Due to Karina Shelton history and age, she should receive screening for *** skin cancers, breast cancer, and colon cancer. The patient was encouraged to follow-up with her PCP for appropriate cancer screenings.   ***. Health maintenance and wellness promotion: Karina Shelton was encouraged to consume 5-7 servings of fruits and vegetables per day. The patient was also encouraged to engage in moderate to vigorous exercise for 30 minutes per day most days of the week. Karina Shelton was instructed to limit her alcohol  consumption and continue to abstain from tobacco use.  ***    Disposition:  -Return to Cancer Center to see the APP in Long Term Survivorship in 1 year.   A total of *** minutes of face-to-face time was spent with this patient with greater than 50% of that time in counseling and care-coordination.   Yazmin Shelton A. [place], PA-C Physician Assistant [facility]   Note: PRIMARY CARE PROVIDER Trinidad Hun, MD [phone]

## 2024-04-09 DIAGNOSIS — R0600 Dyspnea, unspecified: Secondary | ICD-10-CM | POA: Diagnosis not present

## 2024-04-10 ENCOUNTER — Inpatient Hospital Stay: Admitting: Hematology and Oncology

## 2024-04-10 ENCOUNTER — Inpatient Hospital Stay

## 2024-04-12 DIAGNOSIS — R0600 Dyspnea, unspecified: Secondary | ICD-10-CM | POA: Diagnosis not present

## 2024-04-24 DIAGNOSIS — R635 Abnormal weight gain: Secondary | ICD-10-CM | POA: Diagnosis not present

## 2024-04-24 DIAGNOSIS — Z6833 Body mass index (BMI) 33.0-33.9, adult: Secondary | ICD-10-CM | POA: Diagnosis not present

## 2024-04-24 DIAGNOSIS — J9610 Chronic respiratory failure, unspecified whether with hypoxia or hypercapnia: Secondary | ICD-10-CM | POA: Diagnosis not present

## 2024-04-24 DIAGNOSIS — F339 Major depressive disorder, recurrent, unspecified: Secondary | ICD-10-CM | POA: Diagnosis not present
